# Patient Record
Sex: Female | Born: 1990 | Race: White | Marital: Single | Smoking: Current every day smoker
Health system: Southern US, Community
[De-identification: ages and names within clinical notes are randomized; demographics above are authoritative.]

## PROBLEM LIST (undated history)

## (undated) DIAGNOSIS — F419 Anxiety disorder, unspecified: Secondary | ICD-10-CM

## (undated) DIAGNOSIS — F32A Depression, unspecified: Secondary | ICD-10-CM

## (undated) DIAGNOSIS — F909 Attention-deficit hyperactivity disorder, unspecified type: Secondary | ICD-10-CM

## (undated) DIAGNOSIS — R569 Unspecified convulsions: Secondary | ICD-10-CM

## (undated) DIAGNOSIS — R519 Headache, unspecified: Secondary | ICD-10-CM

## (undated) DIAGNOSIS — B977 Papillomavirus as the cause of diseases classified elsewhere: Secondary | ICD-10-CM

## (undated) DIAGNOSIS — R87629 Unspecified abnormal cytological findings in specimens from vagina: Secondary | ICD-10-CM

## (undated) HISTORY — PX: CHOLECYSTECTOMY: SHX55

## (undated) HISTORY — PX: LEEP: SHX91

## (undated) HISTORY — DX: Unspecified convulsions: R56.9

## (undated) HISTORY — DX: Anxiety disorder, unspecified: F41.9

## (undated) HISTORY — DX: Headache, unspecified: R51.9

## (undated) HISTORY — DX: Attention-deficit hyperactivity disorder, unspecified type: F90.9

## (undated) HISTORY — PX: AUGMENTATION MAMMAPLASTY: SUR837

## (undated) HISTORY — DX: Unspecified abnormal cytological findings in specimens from vagina: R87.629

## (undated) HISTORY — DX: Papillomavirus as the cause of diseases classified elsewhere: B97.7

## (undated) HISTORY — DX: Depression, unspecified: F32.A

---
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
## 2011-02-07 ENCOUNTER — Emergency Department (HOSPITAL_COMMUNITY): Payer: 59

## 2011-02-07 ENCOUNTER — Emergency Department (HOSPITAL_COMMUNITY)
Admission: EM | Admit: 2011-02-07 | Discharge: 2011-02-07 | Disposition: A | Discharge status: Home / Self Care | Payer: 59 | Attending: Emergency Medicine | Admitting: Emergency Medicine

## 2011-02-07 DIAGNOSIS — R509 Fever, unspecified: Secondary | ICD-10-CM | POA: Insufficient documentation

## 2011-02-07 DIAGNOSIS — R5381 Other malaise: Secondary | ICD-10-CM | POA: Insufficient documentation

## 2011-02-07 DIAGNOSIS — R112 Nausea with vomiting, unspecified: Secondary | ICD-10-CM | POA: Insufficient documentation

## 2011-02-07 DIAGNOSIS — R197 Diarrhea, unspecified: Secondary | ICD-10-CM | POA: Insufficient documentation

## 2011-02-07 DIAGNOSIS — R109 Unspecified abdominal pain: Secondary | ICD-10-CM | POA: Insufficient documentation

## 2011-02-07 DIAGNOSIS — R5383 Other fatigue: Secondary | ICD-10-CM | POA: Insufficient documentation

## 2011-02-07 LAB — DIFFERENTIAL
Basophils Absolute: 0.1 10*3/uL (ref 0.0–0.1)
Eosinophils Relative: 1 % (ref 0–5)
Lymphs Abs: 0.9 10*3/uL (ref 0.7–4.0)
Neutro Abs: 4.6 10*3/uL (ref 1.7–7.7)

## 2011-02-07 LAB — CBC
MCH: 30.3 pg (ref 26.0–34.0)
MCHC: 34.9 g/dL (ref 30.0–36.0)
Platelets: 191 10*3/uL (ref 150–400)
RBC: 4.62 MIL/uL (ref 3.87–5.11)
WBC: 6.1 10*3/uL (ref 4.0–10.5)

## 2011-02-07 LAB — URINE MICROSCOPIC-ADD ON

## 2011-02-07 LAB — URINALYSIS, ROUTINE W REFLEX MICROSCOPIC
Bilirubin Urine: NEGATIVE
Glucose, UA: NEGATIVE mg/dL
Leukocytes, UA: NEGATIVE
Protein, ur: NEGATIVE mg/dL

## 2011-02-07 LAB — POCT I-STAT, CHEM 8
BUN: 11 mg/dL (ref 6–23)
Chloride: 107 mEq/L (ref 96–112)
Creatinine, Ser: 0.9 mg/dL (ref 0.50–1.10)
Glucose, Bld: 81 mg/dL (ref 70–99)
Potassium: 3.5 mEq/L (ref 3.5–5.1)
Sodium: 140 mEq/L (ref 135–145)

## 2011-02-07 LAB — CLOSTRIDIUM DIFFICILE BY PCR: Toxigenic C. Difficile by PCR: NEGATIVE

## 2011-02-07 LAB — POCT PREGNANCY, URINE: Preg Test, Ur: NEGATIVE

## 2011-02-07 MED ORDER — IOHEXOL 300 MG/ML  SOLN
100.0000 mL | Freq: Once | INTRAMUSCULAR | Status: AC | PRN
  Administered 2011-02-07: 100 mL via INTRAVENOUS

## 2011-02-11 LAB — STOOL CULTURE

## 2016-07-25 ENCOUNTER — Ambulatory Visit (INDEPENDENT_AMBULATORY_CARE_PROVIDER_SITE_OTHER): Payer: PRIVATE HEALTH INSURANCE | Admitting: Family Nurse Practitioner

## 2016-07-25 ENCOUNTER — Encounter (INDEPENDENT_AMBULATORY_CARE_PROVIDER_SITE_OTHER): Payer: Self-pay

## 2016-07-25 ENCOUNTER — Encounter (INDEPENDENT_AMBULATORY_CARE_PROVIDER_SITE_OTHER): Payer: Self-pay | Admitting: Family Nurse Practitioner

## 2016-07-25 VITALS — BP 108/73 | HR 103 | Temp 98.7°F | Resp 16 | Ht 67.0 in | Wt 185.8 lb

## 2016-07-25 DIAGNOSIS — M549 Dorsalgia, unspecified: Secondary | ICD-10-CM

## 2016-07-25 MED ORDER — TIZANIDINE HCL 4 MG PO TABS
4.0000 mg | ORAL_TABLET | Freq: Four times a day (QID) | ORAL | 0 refills | Status: AC | PRN
Start: 2016-07-25 — End: ?

## 2016-07-25 NOTE — Progress Notes (Signed)
[facility] [place]    PROGRESS NOTE      Patient: Patty Bennett   Date: 07/25/2016   MRN: [id number]     Past Medical History:   Diagnosis Date   . Anxiety    . Attention deficit hyperactivity disorder (ADHD)    . Depression      Social History     Social History   . Marital status: Single     Spouse name: N/A   . Number of children: N/A   . Years of education: N/A     Occupational History   . working full time       Social History Main Topics   . Smoking status: Current Every Day Smoker   . Smokeless tobacco: Never Used   . Alcohol use No   . Drug use: No   . Sexual activity: Yes     Other Topics Concern   . Not on file     Social History Narrative   . No narrative on file     Family History   Problem Relation Age of Onset   . Thyroid disease Mother    . Osteopenia Mother    . Hyperlipidemia Father    . Cancer Maternal Grandmother      breast and ovarian   . Stroke Paternal Grandfather            ASSESSMENT/PLAN     Patty Bennett is a 26 y.o. female    Chief Complaint   Patient presents with   . Back Pain     2 weeks   . Neck Pain        1. Other acute back pain  - Ambulatory referral to Physical Therapy  - tiZANidine (ZANAFLEX) 4 MG tablet; Take 1 tablet (4 mg total) by mouth every 6 (six) hours as needed (back pain).  Dispense: 30 tablet; Refill: 0         Will try zanaflex  Refer to PT - eval and treat  Continue gentle stretching  Follow up as needed    Risk & Benefits of the new medication(s) if started at this visit, were explained to the patient (and family) who verbalized understanding & they are in agreement to the treatment plan.     Patient (family) encouraged to contact me/clinical staff with any questions/concerns    MEDICATIONS     Current Outpatient Prescriptions   Medication Sig Dispense Refill   . amphetamine-dextroamphetamine (ADDERALL) 20 MG tablet Take 20 mg by mouth daily.     . clonazePAM (KLONOPIN) 0.5 MG tablet Take 0.5 mg by mouth as needed.     Marland Kitchen MONO-LINYAH 0.25-35  MG-MCG per tablet      . zolpidem (AMBIEN) 10 MG tablet Take 10 mg by mouth as needed for Sleep.     Marland Kitchen tiZANidine (ZANAFLEX) 4 MG tablet Take 1 tablet (4 mg total) by mouth every 6 (six) hours as needed (back pain). 30 tablet 0     No current facility-administered medications for this visit.        Allergies   Allergen Reactions   . Mucinex Chest Congestion Child [Guaifenesin]    . Omnicef [Cefdinir]    . Sulfa Antibiotics      hives       SUBJECTIVE     Chief Complaint   Patient presents with   . Back Pain     2 weeks   . Neck Pain  HPI    Pt is a pleasant 25yo female who presents for upper back pain ongoing for nearly 2 weeks. She states pain is worse with movement. She denies any trauma. Works as a Therapist, occupational. Denies any fevers. Limited ROM of neck, worse when looking right. She has tried ibuprofen, excedrin, massage and an (expired) flexeril tablet that she had. Last night she had difficulty sleeping.        ROS     Review of Systems   Constitutional: Negative for chills, fatigue and fever.   Respiratory: Negative for cough, shortness of breath and wheezing.    Cardiovascular: Negative for chest pain.   Gastrointestinal: Negative for abdominal pain.   Musculoskeletal: Positive for back pain and neck pain.   All other systems reviewed and are negative.          The following portions of the patient's history were reviewed and updated as appropriate: Allergies, Current Medications, Past Family History, Past Medical history, Past social history, Past surgical history, and Problem List.      PHYSICAL EXAM       Vitals:    07/25/16 1531   BP: 108/73   BP Site: Right arm   Patient Position: Sitting   Cuff Size: Large   Pulse: (!) 103   Resp: 16   Temp: 98.7 F (37.1 C)   TempSrc: Oral   SpO2: 98%   Weight: 84.3 kg (185 lb 12.8 oz)   Height: 1.702 m (5\' 7" )       Physical Exam   Constitutional: She appears well-developed and well-nourished.   Neck: Muscular tenderness present. No spinous process tenderness  present. Decreased range of motion present. No Brudzinski's sign noted.   Cardiovascular: Normal rate, regular rhythm and normal heart sounds.    Pulmonary/Chest: Effort normal and breath sounds normal.   Musculoskeletal:        Cervical back: She exhibits decreased range of motion, tenderness and pain. She exhibits no bony tenderness.   Neurological: She is alert.   Nursing note and vitals reviewed.      No results found for this or any previous visit.    Verl Blalock Adaia Matthies, FNP-BC  07/25/2016

## 2016-07-25 NOTE — Progress Notes (Signed)
Nursing Documentation:  Limb alert status: Patient asked and denied any limb restrictions for blood pressure/blood draws.  Has the patient seen any other providers since their last visit: no  The patient is due for nothing at this time, HM is up-to-date.

## 2020-02-08 ENCOUNTER — Ambulatory Visit (INDEPENDENT_AMBULATORY_CARE_PROVIDER_SITE_OTHER): Payer: Self-pay | Admitting: Plastic Surgery

## 2020-02-08 ENCOUNTER — Encounter: Payer: Self-pay | Admitting: Plastic Surgery

## 2020-02-08 ENCOUNTER — Other Ambulatory Visit: Payer: Self-pay

## 2020-02-08 VITALS — BP 117/81 | HR 111 | Temp 99.0°F | Ht 67.0 in | Wt 168.2 lb

## 2020-02-08 DIAGNOSIS — N644 Mastodynia: Secondary | ICD-10-CM

## 2020-02-08 DIAGNOSIS — Z719 Counseling, unspecified: Secondary | ICD-10-CM | POA: Insufficient documentation

## 2020-02-08 NOTE — Progress Notes (Signed)
Patient ID: Lindsey Swanson, female    DOB: 02/17/1991, 29 y.o.   MRN: [id number]   Chief Complaint  Patient presents with  . Advice Only  . Breast Problem    The patient is a 29 year old female here for evaluation of her breast implants.  The patient underwent saline implant placement 10 years ago in [place] with Dr. Felicie Morn.  She believes that they were placed submuscular.  She went from A cup to a C/D cup.  She does not have a card and has not been able to get a hold of anybody at Dr. Shaune Leeks office.  She has noticed that for the past 6 months the implants have started to move.  She also notices that the right lateral side has been painful and uncomfortable for 2 months.  There is been no trauma or accident that she is aware of.  She is 5 feet 7 inches tall weighs 168 pounds.  The implants appear soft.  There does not appear to be any firm capsular contracture.  I do see the rippling and noticed the movement of the implants.  At this time they appear to be above the muscle.  The patient would like to keep her size.  She has not had a mammogram.  She is otherwise in good health and works for a Radiation protection practitioner.   Review of Systems  Constitutional: Negative.  Negative for activity change.  HENT: Negative.   Eyes: Negative.   Respiratory: Negative.  Negative for chest tightness and shortness of breath.   Cardiovascular: Negative for leg swelling.  Gastrointestinal: Negative for abdominal distention and abdominal pain.  Endocrine: Negative.   Genitourinary: Negative.   Musculoskeletal: Negative.   Neurological: Negative.   Hematological: Negative.   Psychiatric/Behavioral: Negative.     History reviewed. No pertinent past medical history.  History reviewed. No pertinent surgical history.    Current Outpatient Medications:  .  amphetamine-dextroamphetamine (ADDERALL) 20 MG tablet, Take 1 tablet by mouth daily., Disp: , Rfl:  .  cloBAZam (ONFI) 10 MG tablet, Take 20 mg by mouth  daily., Disp: , Rfl:  .  clonazePAM (KLONOPIN) 0.5 MG tablet, Take 0.5 mg by mouth 2 (two) times daily as needed., Disp: , Rfl:  .  norgestimate-ethinyl estradiol (ORTHO-CYCLEN) 0.25-35 MG-MCG tablet, Take 1 tablet by mouth daily., Disp: , Rfl:  .  tiZANidine (ZANAFLEX) 4 MG tablet, Take 1 tablet by mouth as needed., Disp: , Rfl:  .  VIMPAT 150 MG TABS, Take 1 tablet by mouth 2 (two) times daily., Disp: , Rfl:  .  zolpidem (AMBIEN) 10 MG tablet, Take 10 mg by mouth at bedtime as needed., Disp: , Rfl:    Objective:   Vitals:   02/08/20 0759  BP: 117/81  Pulse: (!) 111  Temp: 99 F (37.2 C)  SpO2: 100%    Physical Exam Vitals and nursing note reviewed.  Constitutional:      Appearance: Normal appearance.  HENT:     Head: Normocephalic and atraumatic.  Eyes:     Extraocular Movements: Extraocular movements intact.  Cardiovascular:     Rate and Rhythm: Normal rate.     Pulses: Normal pulses.  Pulmonary:     Effort: Pulmonary effort is normal.  Abdominal:     General: Abdomen is flat. There is no distension.     Tenderness: There is no abdominal tenderness.  Musculoskeletal:        General: Normal range of motion.  Skin:  General: Skin is warm.     Capillary Refill: Capillary refill takes less than 2 seconds.  Neurological:     General: No focal deficit present.     Mental Status: She is alert and oriented to person, place, and time.  Psychiatric:        Mood and Affect: Mood normal.        Behavior: Behavior normal.     Assessment & Plan:  Breast pain in female - Plan: MM Digital Screening  Encounter for counseling - Plan: MM Digital Screening  Recommend a mammogram to further evaluate the breast pain. The patient is a good candidate for replacement of the implants.  She is willing to go or interested in going with silicone implants.  We will try and get her previous reports from Dr. Felicie Morn.  Quote will be provided for her.  We will wait to hear back from her she  is interested and after we get the mammogram results.  Pictures were obtained of the patient and placed in the chart with the patient's or guardian's permission.   Alena Bills King Pinzon, DO

## 2020-02-09 ENCOUNTER — Other Ambulatory Visit: Payer: Self-pay | Admitting: Plastic Surgery

## 2020-02-09 DIAGNOSIS — N644 Mastodynia: Secondary | ICD-10-CM

## 2020-02-26 ENCOUNTER — Other Ambulatory Visit: Payer: Self-pay | Admitting: Plastic Surgery

## 2020-02-26 ENCOUNTER — Other Ambulatory Visit: Payer: Self-pay

## 2020-02-26 ENCOUNTER — Ambulatory Visit
Admission: RE | Admit: 2020-02-26 | Discharge: 2020-02-26 | Disposition: A | Discharge status: Home / Self Care | Payer: BC Managed Care – PPO | Source: Ambulatory Visit | Attending: Plastic Surgery | Admitting: Plastic Surgery

## 2020-02-26 ENCOUNTER — Ambulatory Visit: Admission: RE | Admit: 2020-02-26 | Payer: BC Managed Care – PPO | Source: Ambulatory Visit

## 2020-02-26 DIAGNOSIS — N644 Mastodynia: Secondary | ICD-10-CM

## 2020-04-01 LAB — OB RESULTS CONSOLE HIV ANTIBODY (ROUTINE TESTING): HIV: NONREACTIVE

## 2020-04-01 LAB — OB RESULTS CONSOLE RUBELLA ANTIBODY, IGM: Rubella: NON-IMMUNE/NOT IMMUNE

## 2020-04-01 LAB — OB RESULTS CONSOLE RPR: RPR: NONREACTIVE

## 2020-04-01 LAB — OB RESULTS CONSOLE HEPATITIS B SURFACE ANTIGEN: Hepatitis B Surface Ag: NEGATIVE

## 2020-04-12 ENCOUNTER — Other Ambulatory Visit: Payer: Self-pay | Admitting: Obstetrics and Gynecology

## 2020-04-12 ENCOUNTER — Telehealth: Payer: Self-pay

## 2020-04-12 DIAGNOSIS — R569 Unspecified convulsions: Secondary | ICD-10-CM

## 2020-04-12 NOTE — Telephone Encounter (Signed)
Spoke with patient re referral that we received from Dr. Garlan Fillers office - per Dr. Parke Poisson he wanted her to have an early anatomy with the MD Consult.   Patient is scheduled for 04/29/20 for a 11-14 wk Anatomy and MD Consult.

## 2020-04-27 ENCOUNTER — Encounter: Payer: Self-pay | Admitting: *Deleted

## 2020-04-29 ENCOUNTER — Other Ambulatory Visit: Payer: Self-pay | Admitting: *Deleted

## 2020-04-29 ENCOUNTER — Ambulatory Visit: Payer: BC Managed Care – PPO | Attending: Obstetrics and Gynecology

## 2020-04-29 ENCOUNTER — Ambulatory Visit (HOSPITAL_BASED_OUTPATIENT_CLINIC_OR_DEPARTMENT_OTHER): Payer: BC Managed Care – PPO | Admitting: Obstetrics

## 2020-04-29 ENCOUNTER — Ambulatory Visit: Payer: BC Managed Care – PPO | Admitting: *Deleted

## 2020-04-29 ENCOUNTER — Encounter: Payer: Self-pay | Admitting: *Deleted

## 2020-04-29 ENCOUNTER — Other Ambulatory Visit: Payer: Self-pay

## 2020-04-29 VITALS — BP 121/75 | HR 92

## 2020-04-29 DIAGNOSIS — Z3A12 12 weeks gestation of pregnancy: Secondary | ICD-10-CM | POA: Insufficient documentation

## 2020-04-29 DIAGNOSIS — G40909 Epilepsy, unspecified, not intractable, without status epilepticus: Secondary | ICD-10-CM | POA: Diagnosis present

## 2020-04-29 DIAGNOSIS — R569 Unspecified convulsions: Secondary | ICD-10-CM | POA: Diagnosis not present

## 2020-04-29 DIAGNOSIS — O99351 Diseases of the nervous system complicating pregnancy, first trimester: Secondary | ICD-10-CM

## 2020-04-29 DIAGNOSIS — F419 Anxiety disorder, unspecified: Secondary | ICD-10-CM

## 2020-04-29 DIAGNOSIS — O99341 Other mental disorders complicating pregnancy, first trimester: Secondary | ICD-10-CM

## 2020-04-29 DIAGNOSIS — O3441 Maternal care for other abnormalities of cervix, first trimester: Secondary | ICD-10-CM

## 2020-04-29 DIAGNOSIS — O09891 Supervision of other high risk pregnancies, first trimester: Secondary | ICD-10-CM

## 2020-04-29 NOTE — Progress Notes (Signed)
MFM Consult Note  Lindsey Swanson is a 29 year old gravida 1 para 0 currently at 12 weeks and 2 days.  She was seen in consultation today due to multiple medication exposure.  The patient reports that prior to pregnancy, she was treated with Adderall for ADHD, Klonopin (clonazepam) for anxiety, tizanidine for muscle relaxation, and Vimpat for her seizure disorder.    She reports that once she found out she was pregnant, she discontinued the Klonopin, Adderall, and tizanidine.  She is currently taking a prenatal vitamin, folic acid, and Vimpat for seizure prophylaxis.  She was taking Klonopin 0.5 mg twice a day.  Since the Klonopin was discontinued, the patient reports that she has not been feeling normal and often feels like she may have a seizure.  The patient is being followed by a neurologist, Dr. Ramiro Harvest at [facility].  She notified her neurologist regarding her current pregnancy.  However, her neurologist has not provided any recommendations for the management of her seizure disorder.    The patient reports that she would like to resume taking Klonopin as she most likely will not be able to perform normal daily functions without this medication.  She will not need any of the other medications she was taking prior to pregnancy.  She reports that she had a blood test earlier in her pregnancy (?  Cell free DNA test ) where she was told that the fetus is a female.  However, she was not told of the results regarding fetal aneuploidy screening.  The patient was seen in consultation today to discuss the management of her medications in pregnancy  Past medical history includes anxiety and a possible seizure disorder.  Past surgical history includes gallbladder removal in 2009, breast augmentation surgery in 2011, and a LEEP.   She denies any alcohol, tobacco, or illegal drug use.  Her current medications include a prenatal vitamin, folic acid, Vimpat, and Bonjesta for treatment of nausea associated with  pregnancy.  An ultrasound performed today shows a crown-rump length that is consistent with her gestational age.  There were no obvious fetal anomalies noted on today's early ultrasound exam.  The patient was reassured by today's ultrasound findings.  The patient and her partner were advised that there is limited information regarding the pregnancy outcomes of women taking Klonopin and Vimpat in pregnancy.  She was advised that benzodiazepine treatment during pregnancy has been associated with possible increased risk of birth defects and possible neonatal withdrawal symptoms.  There is even less information regarding Vimpat treatment in pregnancy.    Although there may be some risks in pregnancy associated with treatment with Klonopin and Vimpat, the overall risk of birth defects is probably low.  She was advised that she may resume taking Klonopin during her current pregnancy and continue taking Klonopin for the duration of her pregnancy, as she may not be able to perform normal daily functions without this medication and as the benefits probably outweigh the risks.  To help with the management of the dosage of her antiseizure medications, she should probably see a local neurologist here at [facility].  A detailed fetal anatomy scan has been scheduled for her in our office at around 19 weeks to screen for congenital birth defects.  We will also refer her to pediatric cardiology for a fetal echocardiogram at around 21 to 22 weeks.  She should then continue to be followed with serial growth ultrasounds.  The patient was reassured that I have managed the pregnancies of pregnant women taking  Klonopin and Vimpat in the past with good maternal and neonatal outcomes.  We will continue to follow her closely with you throughout her pregnancy.  At the end of the consultation, both the patient and her partner stated that all of her questions have been answered to her complete satisfaction.    Thank you for  referring this patient for a Maternal-Fetal Medicine consultation.  Recommendations:  Resume Klonopin at a dosage recommended by her neurologist Continue Vimpat, prenatal vitamins, and folic acid Detailed fetal anatomy scan at around 19 weeks Fetal echocardiogram with pediatric cardiology at 21 to 22-week Serial growth ultrasounds

## 2020-05-28 NOTE — L&D Delivery Note (Signed)
Delivery Note At 3:32 AM a viable female was delivered via Vaginal, Spontaneous (Presentation: Left Occiput Anterior).  APGAR: 8, 9; weight  .   Placenta status: Spontaneous, Intact.  Cord: 3 vessels with the following complications: None.  Cord pH: n/a  Anesthesia: Epidural Episiotomy: None Lacerations:  vaginal, first degree Suture Repair: 3.0 vicryl rapide Est. Blood Loss (mL):  400  Mom to postpartum.  Baby to Couplet care / Skin to Skin.  Mitchel Honour 11/08/2020, 3:53 AM

## 2020-06-10 ENCOUNTER — Other Ambulatory Visit: Payer: Self-pay | Admitting: *Deleted

## 2020-06-10 ENCOUNTER — Ambulatory Visit: Payer: BC Managed Care – PPO | Admitting: *Deleted

## 2020-06-10 ENCOUNTER — Ambulatory Visit: Payer: BC Managed Care – PPO | Attending: Obstetrics and Gynecology

## 2020-06-10 ENCOUNTER — Other Ambulatory Visit: Payer: Self-pay

## 2020-06-10 ENCOUNTER — Encounter: Payer: Self-pay | Admitting: *Deleted

## 2020-06-10 VITALS — BP 103/56 | HR 93

## 2020-06-10 DIAGNOSIS — F419 Anxiety disorder, unspecified: Secondary | ICD-10-CM

## 2020-06-10 DIAGNOSIS — O09891 Supervision of other high risk pregnancies, first trimester: Secondary | ICD-10-CM | POA: Diagnosis not present

## 2020-06-10 DIAGNOSIS — O99352 Diseases of the nervous system complicating pregnancy, second trimester: Secondary | ICD-10-CM | POA: Diagnosis not present

## 2020-06-10 DIAGNOSIS — O99342 Other mental disorders complicating pregnancy, second trimester: Secondary | ICD-10-CM | POA: Diagnosis not present

## 2020-06-10 DIAGNOSIS — O09892 Supervision of other high risk pregnancies, second trimester: Secondary | ICD-10-CM

## 2020-06-10 DIAGNOSIS — G40909 Epilepsy, unspecified, not intractable, without status epilepticus: Secondary | ICD-10-CM

## 2020-06-10 DIAGNOSIS — Z363 Encounter for antenatal screening for malformations: Secondary | ICD-10-CM

## 2020-06-10 DIAGNOSIS — O4402 Placenta previa specified as without hemorrhage, second trimester: Secondary | ICD-10-CM | POA: Diagnosis not present

## 2020-06-10 DIAGNOSIS — Z3A18 18 weeks gestation of pregnancy: Secondary | ICD-10-CM

## 2020-07-08 ENCOUNTER — Ambulatory Visit: Payer: BC Managed Care – PPO

## 2020-10-14 LAB — OB RESULTS CONSOLE GBS: GBS: NEGATIVE

## 2020-11-06 ENCOUNTER — Other Ambulatory Visit: Payer: Self-pay

## 2020-11-06 ENCOUNTER — Encounter (HOSPITAL_COMMUNITY): Payer: Self-pay | Admitting: Obstetrics and Gynecology

## 2020-11-06 ENCOUNTER — Inpatient Hospital Stay (HOSPITAL_COMMUNITY)
Admission: AD | Admit: 2020-11-06 | Discharge: 2020-11-10 | DRG: 806 | Disposition: A | Discharge status: Home / Self Care | Payer: BC Managed Care – PPO | Attending: Obstetrics & Gynecology | Admitting: Obstetrics & Gynecology

## 2020-11-06 DIAGNOSIS — G40909 Epilepsy, unspecified, not intractable, without status epilepticus: Secondary | ICD-10-CM | POA: Diagnosis present

## 2020-11-06 DIAGNOSIS — O9852 Other viral diseases complicating childbirth: Secondary | ICD-10-CM | POA: Diagnosis present

## 2020-11-06 DIAGNOSIS — F419 Anxiety disorder, unspecified: Secondary | ICD-10-CM | POA: Diagnosis present

## 2020-11-06 DIAGNOSIS — O99354 Diseases of the nervous system complicating childbirth: Secondary | ICD-10-CM | POA: Diagnosis present

## 2020-11-06 DIAGNOSIS — Z20822 Contact with and (suspected) exposure to covid-19: Secondary | ICD-10-CM | POA: Diagnosis present

## 2020-11-06 DIAGNOSIS — Z3A40 40 weeks gestation of pregnancy: Secondary | ICD-10-CM

## 2020-11-06 DIAGNOSIS — O99344 Other mental disorders complicating childbirth: Secondary | ICD-10-CM | POA: Diagnosis present

## 2020-11-06 DIAGNOSIS — O480 Post-term pregnancy: Principal | ICD-10-CM | POA: Diagnosis present

## 2020-11-06 DIAGNOSIS — Z23 Encounter for immunization: Secondary | ICD-10-CM

## 2020-11-06 DIAGNOSIS — Z3490 Encounter for supervision of normal pregnancy, unspecified, unspecified trimester: Secondary | ICD-10-CM

## 2020-11-06 DIAGNOSIS — Z87891 Personal history of nicotine dependence: Secondary | ICD-10-CM

## 2020-11-06 DIAGNOSIS — B009 Herpesviral infection, unspecified: Secondary | ICD-10-CM | POA: Diagnosis present

## 2020-11-06 NOTE — MAU Note (Signed)
Pt reports she took a shower and dried off and  few minutes later some fluid started leaking out.  Still having a little leaking out not a big gush. Reports some period like cramping 1-2 times an hour but nothing consistent and c/o some lower back pain. Good fetal movement felt

## 2020-11-07 ENCOUNTER — Inpatient Hospital Stay (HOSPITAL_COMMUNITY): Payer: BC Managed Care – PPO | Admitting: Anesthesiology

## 2020-11-07 ENCOUNTER — Encounter (HOSPITAL_COMMUNITY): Payer: Self-pay | Admitting: Obstetrics and Gynecology

## 2020-11-07 ENCOUNTER — Other Ambulatory Visit: Payer: Self-pay

## 2020-11-07 DIAGNOSIS — O480 Post-term pregnancy: Secondary | ICD-10-CM | POA: Diagnosis present

## 2020-11-07 DIAGNOSIS — B009 Herpesviral infection, unspecified: Secondary | ICD-10-CM | POA: Diagnosis present

## 2020-11-07 DIAGNOSIS — Z20822 Contact with and (suspected) exposure to covid-19: Secondary | ICD-10-CM | POA: Diagnosis present

## 2020-11-07 DIAGNOSIS — O99344 Other mental disorders complicating childbirth: Secondary | ICD-10-CM | POA: Diagnosis present

## 2020-11-07 DIAGNOSIS — G40909 Epilepsy, unspecified, not intractable, without status epilepticus: Secondary | ICD-10-CM | POA: Diagnosis present

## 2020-11-07 DIAGNOSIS — Z23 Encounter for immunization: Secondary | ICD-10-CM | POA: Diagnosis not present

## 2020-11-07 DIAGNOSIS — O99354 Diseases of the nervous system complicating childbirth: Secondary | ICD-10-CM | POA: Diagnosis present

## 2020-11-07 DIAGNOSIS — O9852 Other viral diseases complicating childbirth: Secondary | ICD-10-CM | POA: Diagnosis present

## 2020-11-07 DIAGNOSIS — F419 Anxiety disorder, unspecified: Secondary | ICD-10-CM | POA: Diagnosis present

## 2020-11-07 DIAGNOSIS — Z3A40 40 weeks gestation of pregnancy: Secondary | ICD-10-CM | POA: Diagnosis not present

## 2020-11-07 DIAGNOSIS — Z87891 Personal history of nicotine dependence: Secondary | ICD-10-CM | POA: Diagnosis not present

## 2020-11-07 LAB — CBC
HCT: 35.4 % — ABNORMAL LOW (ref 36.0–46.0)
Hemoglobin: 11.3 g/dL — ABNORMAL LOW (ref 12.0–15.0)
MCH: 28.3 pg (ref 26.0–34.0)
MCHC: 31.9 g/dL (ref 30.0–36.0)
MCV: 88.5 fL (ref 80.0–100.0)
Platelets: 254 10*3/uL (ref 150–400)
RBC: 4 MIL/uL (ref 3.87–5.11)
RDW: 14.6 % (ref 11.5–15.5)
WBC: 13.5 10*3/uL — ABNORMAL HIGH (ref 4.0–10.5)
nRBC: 0.3 % — ABNORMAL HIGH (ref 0.0–0.2)

## 2020-11-07 LAB — RPR: RPR Ser Ql: NONREACTIVE

## 2020-11-07 LAB — POCT FERN TEST: POCT Fern Test: NEGATIVE

## 2020-11-07 LAB — AMNISURE RUPTURE OF MEMBRANE (ROM) NOT AT ARMC: Amnisure ROM: NEGATIVE

## 2020-11-07 LAB — TYPE AND SCREEN
ABO/RH(D): A POS
Antibody Screen: NEGATIVE

## 2020-11-07 LAB — SARS CORONAVIRUS 2 (TAT 6-24 HRS): SARS Coronavirus 2: NEGATIVE

## 2020-11-07 MED ORDER — PHENYLEPHRINE 40 MCG/ML (10ML) SYRINGE FOR IV PUSH (FOR BLOOD PRESSURE SUPPORT)
80.0000 ug | PREFILLED_SYRINGE | INTRAVENOUS | Status: DC | PRN
  Filled 2020-11-07: qty 10

## 2020-11-07 MED ORDER — VALACYCLOVIR HCL 500 MG PO TABS
500.0000 mg | ORAL_TABLET | Freq: Two times a day (BID) | ORAL | Status: DC
  Administered 2020-11-07 – 2020-11-08 (×2): 500 mg via ORAL
  Filled 2020-11-07 (×2): qty 1

## 2020-11-07 MED ORDER — FENTANYL-BUPIVACAINE-NACL 0.5-0.125-0.9 MG/250ML-% EP SOLN
12.0000 mL/h | EPIDURAL | Status: DC | PRN
  Administered 2020-11-07: 12 mL/h via EPIDURAL
  Filled 2020-11-07: qty 250

## 2020-11-07 MED ORDER — EPHEDRINE 5 MG/ML INJ: 10.0000 mg | INTRAVENOUS | Status: DC | PRN

## 2020-11-07 MED ORDER — MISOPROSTOL 25 MCG QUARTER TABLET
ORAL_TABLET | ORAL | Status: AC
  Filled 2020-11-07: qty 1

## 2020-11-07 MED ORDER — FLEET ENEMA 7-19 GM/118ML RE ENEM: 1.0000 | ENEMA | RECTAL | Status: DC | PRN

## 2020-11-07 MED ORDER — LACTATED RINGERS IV SOLN: INTRAVENOUS | Status: DC

## 2020-11-07 MED ORDER — ZOLPIDEM TARTRATE 5 MG PO TABS
5.0000 mg | ORAL_TABLET | Freq: Once | ORAL | Status: AC
  Administered 2020-11-07: 5 mg via ORAL
  Filled 2020-11-07: qty 1

## 2020-11-07 MED ORDER — ACETAMINOPHEN 325 MG PO TABS
650.0000 mg | ORAL_TABLET | ORAL | Status: DC | PRN
  Administered 2020-11-07 (×2): 650 mg via ORAL
  Filled 2020-11-07 (×2): qty 2

## 2020-11-07 MED ORDER — SOD CITRATE-CITRIC ACID 500-334 MG/5ML PO SOLN: 30.0000 mL | ORAL | Status: DC | PRN

## 2020-11-07 MED ORDER — TERBUTALINE SULFATE 1 MG/ML IJ SOLN
0.2500 mg | Freq: Once | INTRAMUSCULAR | Status: DC | PRN
  Filled 2020-11-07: qty 1

## 2020-11-07 MED ORDER — OXYTOCIN-SODIUM CHLORIDE 30-0.9 UT/500ML-% IV SOLN
1.0000 m[IU]/min | INTRAVENOUS | Status: DC
  Administered 2020-11-07: 2 m[IU]/min via INTRAVENOUS
  Filled 2020-11-07: qty 500

## 2020-11-07 MED ORDER — LIDOCAINE HCL (PF) 1 % IJ SOLN: 30.0000 mL | INTRAMUSCULAR | Status: DC | PRN

## 2020-11-07 MED ORDER — OXYTOCIN BOLUS FROM INFUSION
333.0000 mL | Freq: Once | INTRAVENOUS | Status: AC
  Administered 2020-11-08: 333 mL via INTRAVENOUS

## 2020-11-07 MED ORDER — LACTATED RINGERS IV SOLN: 500.0000 mL | INTRAVENOUS | Status: DC | PRN

## 2020-11-07 MED ORDER — PHENYLEPHRINE 40 MCG/ML (10ML) SYRINGE FOR IV PUSH (FOR BLOOD PRESSURE SUPPORT)
80.0000 ug | PREFILLED_SYRINGE | INTRAVENOUS | Status: DC | PRN
  Administered 2020-11-07 (×2): 80 ug via INTRAVENOUS
  Filled 2020-11-07: qty 10

## 2020-11-07 MED ORDER — LACOSAMIDE 50 MG PO TABS
150.0000 mg | ORAL_TABLET | Freq: Two times a day (BID) | ORAL | Status: DC
  Administered 2020-11-07 – 2020-11-08 (×2): 150 mg via ORAL
  Filled 2020-11-07 (×2): qty 3

## 2020-11-07 MED ORDER — DIPHENHYDRAMINE HCL 50 MG/ML IJ SOLN: 12.5000 mg | INTRAMUSCULAR | Status: DC | PRN

## 2020-11-07 MED ORDER — OXYCODONE HCL 5 MG PO TABS
5.0000 mg | ORAL_TABLET | ORAL | Status: DC | PRN
  Administered 2020-11-07 (×2): 5 mg via ORAL
  Filled 2020-11-07 (×2): qty 1

## 2020-11-07 MED ORDER — ONDANSETRON HCL 4 MG/2ML IJ SOLN
4.0000 mg | Freq: Four times a day (QID) | INTRAMUSCULAR | Status: DC | PRN
  Administered 2020-11-07 – 2020-11-08 (×2): 4 mg via INTRAVENOUS
  Filled 2020-11-07 (×2): qty 2

## 2020-11-07 MED ORDER — LIDOCAINE HCL (PF) 1 % IJ SOLN
INTRAMUSCULAR | Status: DC | PRN
  Administered 2020-11-07 (×2): 4 mL via EPIDURAL

## 2020-11-07 MED ORDER — OXYTOCIN-SODIUM CHLORIDE 30-0.9 UT/500ML-% IV SOLN
2.5000 [IU]/h | INTRAVENOUS | Status: DC
  Administered 2020-11-08: 2.5 [IU]/h via INTRAVENOUS

## 2020-11-07 MED ORDER — LACTATED RINGERS IV SOLN: 500.0000 mL | Freq: Once | INTRAVENOUS | Status: DC

## 2020-11-07 MED ORDER — OXYCODONE-ACETAMINOPHEN 5-325 MG PO TABS
2.0000 | ORAL_TABLET | ORAL | Status: DC | PRN
Start: 2020-11-07 — End: 2020-11-08

## 2020-11-07 MED ORDER — MISOPROSTOL 25 MCG QUARTER TABLET
25.0000 ug | ORAL_TABLET | ORAL | Status: DC | PRN
Start: 2020-11-07 — End: 2020-11-08
  Administered 2020-11-07 (×3): 25 ug via VAGINAL
  Filled 2020-11-07 (×2): qty 1

## 2020-11-07 MED ORDER — OXYCODONE-ACETAMINOPHEN 5-325 MG PO TABS
1.0000 | ORAL_TABLET | ORAL | Status: DC | PRN
Start: 2020-11-07 — End: 2020-11-08

## 2020-11-07 NOTE — Progress Notes (Signed)
Lindsey Swanson is a 30 y.o. G1P0 at [redacted]w[redacted]d by ultrasound admitted for induction of labor due to Elective at term.  Subjective: Feeling stronger and more frequent CTX.  No VB or LOF.  Active FM.  Objective: BP 127/90   Pulse 90   Temp 97.8 F (36.6 C) (Oral)   Resp 18   Ht 5\' 8"  (1.727 m)   Wt 108.9 kg   LMP 02/01/2020 (Exact Date)   SpO2 99%   BMI 36.49 kg/m  No intake/output data recorded. No intake/output data recorded.  FHT:  FHR: 135 bpm, variability: moderate,  accelerations:  Present,  decelerations:  Absent UC:   regular, every 3-4 minutes SVE:   Dilation: 2 Effacement (%): 50 Station: -3 Exam by:: [email or web address] RN  Labs: Lab Results  Component Value Date   WBC 13.5 (H) 11/07/2020   HGB 11.3 (L) 11/07/2020   HCT 35.4 (L) 11/07/2020   MCV 88.5 11/07/2020   PLT 254 11/07/2020    Assessment / Plan: Induction of labor due to maternal request; progressing normally on cytotec  Labor: Progressing normally Preeclampsia:   n/a Fetal Wellbeing:  Category I Pain Control:  Labor support without medications I/D:  n/a Anticipated MOD:  NSVD  11/09/2020 11/07/2020, 2:07 PM

## 2020-11-07 NOTE — Anesthesia Preprocedure Evaluation (Signed)
Anesthesia Evaluation  Patient identified by MRN, date of birth, ID band Patient awake    Reviewed: Allergy & Precautions, Patient's Chart, lab work & pertinent test results  History of Anesthesia Complications Negative for: history of anesthetic complications  Airway Mallampati: II  TM Distance: >3 FB Neck ROM: Full    Dental no notable dental hx.    Pulmonary former smoker,    Pulmonary exam normal        Cardiovascular negative cardio ROS Normal cardiovascular exam     Neuro/Psych Seizures - (last 04/2019), Well Controlled,  negative psych ROS   GI/Hepatic negative GI ROS, Neg liver ROS,   Endo/Other  negative endocrine ROS  Renal/GU negative Renal ROS  negative genitourinary   Musculoskeletal negative musculoskeletal ROS (+)   Abdominal   Peds  Hematology negative hematology ROS (+)   Anesthesia Other Findings Day of surgery medications reviewed with patient.  Reproductive/Obstetrics (+) Pregnancy                             Anesthesia Physical Anesthesia Plan  ASA: 2  Anesthesia Plan: Epidural   Post-op Pain Management:    Induction:   PONV Risk Score and Plan: Treatment may vary due to age or medical condition  Airway Management Planned: Natural Airway  Additional Equipment:   Intra-op Plan:   Post-operative Plan:   Informed Consent: I have reviewed the patients History and Physical, chart, labs and discussed the procedure including the risks, benefits and alternatives for the proposed anesthesia with the patient or authorized representative who has indicated his/her understanding and acceptance.       Plan Discussed with:   Anesthesia Plan Comments:         Anesthesia Quick Evaluation

## 2020-11-07 NOTE — Progress Notes (Signed)
Lindsey Swanson is a 30 y.o. G1P0 at [redacted]w[redacted]d by ultrasound admitted for induction of labor due to Elective at term.  Subjective: Comfortable with CLEA  Objective: BP 116/65   Pulse 89   Temp 98.8 F (37.1 C) (Oral)   Resp 17   Ht 5\' 8"  (1.727 m)   Wt 108.9 kg   LMP 02/01/2020 (Exact Date)   SpO2 99%   BMI 36.49 kg/m  I/O last 3 completed shifts: In: 1956.4 [I.V.:1956.4] Out: -  Total I/O In: -  Out: 1800 [Urine:1800]  FHT:  FHR: 130 bpm, variability: moderate,  accelerations:  Present,  decelerations:  Absent UC:   difficult to monitor; IUPC placed SVE:   Dilation: 4.5 Effacement (%): 90 Station: -2 Exam by:: Dr. [email or web address]  Labs: Lab Results  Component Value Date   WBC 13.5 (H) 11/07/2020   HGB 11.3 (L) 11/07/2020   HCT 35.4 (L) 11/07/2020   MCV 88.5 11/07/2020   PLT 254 11/07/2020    Assessment / Plan: Induction of labor due to maternal request,  progressing well on pitocin  Labor: Progressing normally Preeclampsia:   n/a Fetal Wellbeing:  Category I Pain Control:  Epidural I/D:  n/a Anticipated MOD:  NSVD  11/09/2020 11/07/2020, 11:59 PM

## 2020-11-07 NOTE — Anesthesia Procedure Notes (Signed)
Epidural Patient location during procedure: OB Start time: 11/07/2020 3:53 PM End time: 11/07/2020 3:57 PM  Staffing Anesthesiologist: Kaylyn Layer, MD Performed: anesthesiologist   Preanesthetic Checklist Completed: patient identified, IV checked, risks and benefits discussed, monitors and equipment checked, pre-op evaluation and timeout performed  Epidural Patient position: sitting Prep: DuraPrep and site prepped and draped Patient monitoring: continuous pulse ox, blood pressure and heart rate Approach: midline Location: L3-L4 Injection technique: LOR air  Needle:  Needle type: Tuohy  Needle gauge: 17 G Needle length: 9 cm Needle insertion depth: 8 cm Catheter type: closed end flexible Catheter size: 19 Gauge Catheter at skin depth: 13 cm Test dose: negative and Other (1% lidocaine)  Assessment Events: blood not aspirated, injection not painful, no injection resistance, no paresthesia and negative IV test  Additional Notes Patient identified. Risks, benefits, and alternatives discussed with patient including but not limited to bleeding, infection, nerve damage, paralysis, failed block, incomplete pain control, headache, blood pressure changes, nausea, vomiting, reactions to medication, itching, and postpartum back pain. Confirmed with bedside nurse the patient's most recent platelet count. Confirmed with patient that they are not currently taking any anticoagulation, have any bleeding history, or any family history of bleeding disorders. Patient expressed understanding and wished to proceed. All questions were answered. Sterile technique was used throughout the entire procedure. Please see nursing notes for vital signs.   Crisp LOR after one needle redirection. Test dose was given through epidural catheter and negative prior to continuing to dose epidural or start infusion. Warning signs of high block given to the patient including shortness of breath, tingling/numbness in  hands, complete motor block, or any concerning symptoms with instructions to call for help. Patient was given instructions on fall risk and not to get out of bed. All questions and concerns addressed with instructions to call with any issues or inadequate analgesia.  Reason for block:procedure for pain

## 2020-11-07 NOTE — H&P (Signed)
Lindsey Swanson is a 30 y.o. female G1 at [redacted]w[redacted]d presenting for IOL. Patient presented to MAU last night for r/o ROM; membranes are intact.  Patient is scheduled for IOL tonight and decision was made to keep patient for IOL secondary to variable decelerations x 2 during extended monitoring in MAU.  FHT has been reassuring since then.  Patient has h/o anxiety d/o and ADHD.  She was unable to d/c all meds except Klonopin which she has taken throughout this pregnancy 0.5 mg BID.  She has been counseled re: possible risk of birth defects and NAS and was referred to MFM given medication exposure.   It was recommended that patient have serial growth u/s and fetal ECHO both of which have been normal; last growth u/s on 4/8.  Patient has h/o seizure d/o; last seizure 2020.  Work up was normal and patient was told it was "stress related."  She takes Vimpat 150 mg BID and 4 mg folic acid.  She has been managed by Neuro this pregnancy (Dr. Ramiro Harvest at [facility]).  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  Patient has suffered with vertigo during pregnancy which has been treated with meclizine.  Patient has H/O LEEP.  She had low risk first tri screen.  GBS negative.  Rubella NI.  OB History     Gravida  1   Para      Term      Preterm      AB      Living         SAB      IAB      Ectopic      Multiple      Live Births             Past Medical History:  Diagnosis Date   ADHD    Anxiety    Headache    HPV in female    Seizures (HCC)    Vaginal Pap smear, abnormal    Past Surgical History:  Procedure Laterality Date   AUGMENTATION MAMMAPLASTY     CHOLECYSTECTOMY     LEEP     Family History: family history includes Breast cancer in her maternal grandmother. Social History:  reports that she quit smoking about 10 years ago. Her smoking use included cigarettes. She has never used smokeless tobacco. She reports previous alcohol use. She reports  that she does not use drugs.     Maternal Diabetes: No Genetic Screening: Normal Maternal Ultrasounds/Referrals: Normal Fetal Ultrasounds or other Referrals:  Fetal echo, Referred to Materal Fetal Medicine  Maternal Substance Abuse:  No Significant Maternal Medications:  Meds include: Other: Klonopin, Vimpat, Meclizine, Valtrex Significant Maternal Lab Results:  Group B Strep negative and Other: Rubella NI Other Comments:  None  Review of Systems Maternal Medical History:  Contractions: Onset was 1-2 hours ago.   Frequency: rare.   Perceived severity is mild.   Fetal activity: Perceived fetal activity is normal.   Last perceived fetal movement was within the past hour.   Prenatal Complications - Diabetes: none.  Dilation: 1 Effacement (%): 50 Station: -3 Exam by:: CO'Rear Blood pressure 127/85, pulse (!) 104, temperature 97.9 F (36.6 C), temperature source Oral, resp. rate 18, height 5\' 8"  (1.727 m), weight 108.9 kg, last menstrual period 02/01/2020, SpO2 99 %. Maternal Exam:  Uterine Assessment: Contraction strength is mild.  Contraction frequency is irregular.  Abdomen: Patient reports no abdominal tenderness. Fundal height is c/w  dates.   Estimated fetal weight is 8#.   Introitus: Normal vulva. Vulva is negative for lesion.  Normal vagina.  Vagina is negative for ulcerations.  Cervix: Cervix evaluated by sterile speculum exam.     Fetal Exam Fetal Monitor Review: Baseline rate: 125.  Variability: moderate (6-25 bpm).   Pattern: accelerations present and no decelerations.   Fetal State Assessment: Category I - tracings are normal.  Physical Exam Constitutional:      Appearance: Normal appearance.  HENT:     Head: Normocephalic and atraumatic.  Pulmonary:     Effort: Pulmonary effort is normal.  Abdominal:     Palpations: Abdomen is soft.  Genitourinary:    General: Normal vulva.  Vulva is no lesion.     Comments: SSE performed secondary to H/O HSV.  No  external vulvar lesions noted.  No vaginal or cervical lesions noted. Musculoskeletal:        General: Normal range of motion.     Cervical back: Normal range of motion.  Skin:    General: Skin is warm and dry.  Neurological:     Mental Status: She is alert and oriented to person, place, and time.  Psychiatric:        Mood and Affect: Mood normal.        Behavior: Behavior normal.    Prenatal labs: ABO, Rh: --/--/A POS (06/13 0118) Antibody: NEG (06/13 0118) Rubella: Nonimmune (11/05 0000) RPR: Nonreactive (11/05 0000)  HBsAg: Negative (11/05 0000)  HIV: Non-reactive (11/05 0000)  GBS: Negative/-- (05/20 0000)   Assessment/Plan: 30yo G1 at [redacted]w[redacted]d for induction of labor -VMP #2 given at 0645; will recheck cervix at 1045 -H/O HSV-has been taking prophylactic valtrex, denies prodrome and negative SSE -Seizure d/o-continue Vimpat -Anxiety-continue Klonopin.   -CLEA when desired -Anticipate NSVD   Mitchel Honour 11/07/2020, 8:35 AM

## 2020-11-07 NOTE — Progress Notes (Signed)
Lindsey Swanson is a 30 y.o. G1P0 at [redacted]w[redacted]d by ultrasound admitted for induction of labor due to Elective at term.  Subjective: Comfortable with CLEA  Objective: BP 126/75   Pulse 84   Temp 97.8 F (36.6 C) (Oral)   Resp 18   Ht 5\' 8"  (1.727 m)   Wt 108.9 kg   LMP 02/01/2020 (Exact Date)   SpO2 100%   BMI 36.49 kg/m  No intake/output data recorded. Total I/O In: 1956.4 [I.V.:1956.4] Out: -   FHT:  FHR: 130 bpm, variability: moderate,  accelerations:  Present,  decelerations:  Absent UC:   regular, every 3 minutes SVE:   Dilation: 3 Effacement (%): 70, 80 Station: -2 Exam by:: Dr.Natalyn Szymanowski AROM, clear  Labs: Lab Results  Component Value Date   WBC 13.5 (H) 11/07/2020   HGB 11.3 (L) 11/07/2020   HCT 35.4 (L) 11/07/2020   MCV 88.5 11/07/2020   PLT 254 11/07/2020    Assessment / Plan: Induction of labor due to maternal request,  progressing well on pitocin  Labor: Progressing normally Preeclampsia:   n/a Fetal Wellbeing:  Category I Pain Control:  Epidural I/D:  n/a Anticipated MOD:  NSVD  11/09/2020 11/07/2020, 5:34 PM

## 2020-11-08 ENCOUNTER — Encounter (HOSPITAL_COMMUNITY): Payer: Self-pay | Admitting: Obstetrics and Gynecology

## 2020-11-08 ENCOUNTER — Inpatient Hospital Stay (HOSPITAL_COMMUNITY)
Admission: AD | Admit: 2020-11-08 | Payer: BC Managed Care – PPO | Source: Home / Self Care | Admitting: Obstetrics & Gynecology

## 2020-11-08 ENCOUNTER — Inpatient Hospital Stay (HOSPITAL_COMMUNITY): Payer: BC Managed Care – PPO

## 2020-11-08 DIAGNOSIS — Z3490 Encounter for supervision of normal pregnancy, unspecified, unspecified trimester: Secondary | ICD-10-CM

## 2020-11-08 LAB — CBC
HCT: 31.7 % — ABNORMAL LOW (ref 36.0–46.0)
Hemoglobin: 10.4 g/dL — ABNORMAL LOW (ref 12.0–15.0)
MCH: 28.7 pg (ref 26.0–34.0)
MCHC: 32.8 g/dL (ref 30.0–36.0)
MCV: 87.6 fL (ref 80.0–100.0)
Platelets: 213 10*3/uL (ref 150–400)
RBC: 3.62 MIL/uL — ABNORMAL LOW (ref 3.87–5.11)
RDW: 14.6 % (ref 11.5–15.5)
WBC: 20.5 10*3/uL — ABNORMAL HIGH (ref 4.0–10.5)
nRBC: 0 % (ref 0.0–0.2)

## 2020-11-08 MED ORDER — MECLIZINE HCL 25 MG PO TABS
25.0000 mg | ORAL_TABLET | Freq: Two times a day (BID) | ORAL | Status: DC
  Filled 2020-11-08 (×6): qty 1

## 2020-11-08 MED ORDER — DIPHENHYDRAMINE HCL 25 MG PO CAPS: 25.0000 mg | ORAL_CAPSULE | Freq: Four times a day (QID) | ORAL | Status: DC | PRN

## 2020-11-08 MED ORDER — SIMETHICONE 80 MG PO CHEW: 80.0000 mg | CHEWABLE_TABLET | ORAL | Status: DC | PRN

## 2020-11-08 MED ORDER — WITCH HAZEL-GLYCERIN EX PADS: 1.0000 "application " | MEDICATED_PAD | CUTANEOUS | Status: DC | PRN

## 2020-11-08 MED ORDER — ZOLPIDEM TARTRATE 5 MG PO TABS: 5.0000 mg | ORAL_TABLET | Freq: Every evening | ORAL | Status: DC | PRN

## 2020-11-08 MED ORDER — COCONUT OIL OIL: 1.0000 "application " | TOPICAL_OIL | Status: DC | PRN

## 2020-11-08 MED ORDER — TETANUS-DIPHTH-ACELL PERTUSSIS 5-2.5-18.5 LF-MCG/0.5 IM SUSY: 0.5000 mL | PREFILLED_SYRINGE | Freq: Once | INTRAMUSCULAR | Status: DC

## 2020-11-08 MED ORDER — OXYCODONE-ACETAMINOPHEN 5-325 MG PO TABS
1.0000 | ORAL_TABLET | ORAL | Status: DC | PRN
  Administered 2020-11-08 – 2020-11-09 (×5): 1 via ORAL
  Filled 2020-11-08 (×5): qty 1

## 2020-11-08 MED ORDER — PRENATAL MULTIVITAMIN CH
1.0000 | ORAL_TABLET | Freq: Every day | ORAL | Status: DC
  Administered 2020-11-08 – 2020-11-10 (×3): 1 via ORAL
  Filled 2020-11-08 (×3): qty 1

## 2020-11-08 MED ORDER — IBUPROFEN 600 MG PO TABS
600.0000 mg | ORAL_TABLET | Freq: Four times a day (QID) | ORAL | Status: DC
  Administered 2020-11-08 – 2020-11-10 (×9): 600 mg via ORAL
  Filled 2020-11-08 (×9): qty 1

## 2020-11-08 MED ORDER — LACOSAMIDE 50 MG PO TABS
150.0000 mg | ORAL_TABLET | Freq: Two times a day (BID) | ORAL | Status: DC
  Administered 2020-11-08 – 2020-11-09 (×4): 150 mg via ORAL
  Filled 2020-11-08 (×6): qty 3

## 2020-11-08 MED ORDER — OXYCODONE-ACETAMINOPHEN 5-325 MG PO TABS: 2.0000 | ORAL_TABLET | ORAL | Status: DC | PRN

## 2020-11-08 MED ORDER — CLONAZEPAM 0.5 MG PO TBDP
0.5000 mg | ORAL_TABLET | Freq: Two times a day (BID) | ORAL | Status: DC
  Administered 2020-11-08 – 2020-11-09 (×3): 0.5 mg via ORAL
  Filled 2020-11-08 (×4): qty 1

## 2020-11-08 MED ORDER — SENNOSIDES-DOCUSATE SODIUM 8.6-50 MG PO TABS
2.0000 | ORAL_TABLET | Freq: Every day | ORAL | Status: DC
  Administered 2020-11-08 – 2020-11-10 (×3): 2 via ORAL
  Filled 2020-11-08 (×3): qty 2

## 2020-11-08 MED ORDER — ONDANSETRON HCL 4 MG PO TABS: 4.0000 mg | ORAL_TABLET | ORAL | Status: DC | PRN

## 2020-11-08 MED ORDER — BENZOCAINE-MENTHOL 20-0.5 % EX AERO
1.0000 "application " | INHALATION_SPRAY | CUTANEOUS | Status: DC | PRN
  Administered 2020-11-08: 1 via TOPICAL
  Filled 2020-11-08: qty 56

## 2020-11-08 MED ORDER — LACOSAMIDE 150 MG PO TABS
1.0000 | ORAL_TABLET | Freq: Two times a day (BID) | ORAL | Status: DC
  Filled 2020-11-08: qty 1

## 2020-11-08 MED ORDER — DIBUCAINE (PERIANAL) 1 % EX OINT: 1.0000 "application " | TOPICAL_OINTMENT | CUTANEOUS | Status: DC | PRN

## 2020-11-08 MED ORDER — ACETAMINOPHEN 325 MG PO TABS: 650.0000 mg | ORAL_TABLET | ORAL | Status: DC | PRN

## 2020-11-08 MED ORDER — ONDANSETRON HCL 4 MG/2ML IJ SOLN: 4.0000 mg | INTRAMUSCULAR | Status: DC | PRN

## 2020-11-08 NOTE — Lactation Note (Addendum)
This note was copied from a baby's chart. Lactation Consultation Note  Patient Name: Lindsey Swanson [id number] Date: 11/08/2020 Reason for consult: Initial assessment Age:30 years   Mother is a P1, infant is  term at 40+1 weeks . Mother has breastfed 3 times, but still says that the first breastfeeding in L&D was the best.  Mother was given [facility] brochure and basic teaching done.   Assist mother with latch infant on in cross cradle hold. Infant on and off with short shallow sucks. Mother complaints of pain . Observed nipple pinched. Teaching done . Infant latched on in football hold. Infant sustained latch with good pattern of sucking/swallows for 15 mins. Mother and father observed swallows. Infant still breastfeeding when I left the room.   Mother to continue to cue base feed infant and feed at least 8-12 times or more in 24 hours and advised to allow for cluster feeding infant as needed.  Mother to continue to due STS. Mother is aware of available LC services at [facility], BFSG'S, OP Dept, and phone # for questions or concerns about breastfeeding.  Mother receptive to all teaching and plan of care.    Maternal Data Has patient been taught Hand Expression?: Yes Does the patient have breastfeeding experience prior to this delivery?: No  Feeding Mother's Current Feeding Choice: Breast Milk  LATCH Score                    Lactation Tools Discussed/Used    Interventions    Discharge    Consult Status      Michel Bickers 11/08/2020, 1:58 PM

## 2020-11-08 NOTE — Lactation Note (Signed)
This note was copied from a baby's chart. Lactation Consultation Note Mom less than hr old at time of consult. RN had just latched baby right before LC came into room. Mom stated pinching some. LC flanged top and bottom lip a little more mom stated better. Baby popped off a couple of times. Noted nipple pinched. Mom holding baby in cross cradle position but holding baby's head instead of breast.  Mom has implants. States behind muscle. Noted Lt. Breast implant very moveable side to side. Mom stated it always does that. Noted scar around nipple. Also mom had nipple piercing at one time. Noted some colostrum coming from piercing holes.  Baby BF Eagerly. Newborn feeding habits reviewed. Answered questions. Will f/u mom on MBU.   Patient Name: Lindsey Swanson [id number] Date: 11/08/2020 Reason for consult: L&D Initial assessment;Primapara;Term;Breast augmentation Age:34 hours  Maternal Data Has patient been taught Hand Expression?: Yes Does the patient have breastfeeding experience prior to this delivery?: No  Feeding    LATCH Score Latch: Grasps breast easily, tongue down, lips flanged, rhythmical sucking.  Audible Swallowing: None  Type of Nipple: Everted at rest and after stimulation  Comfort (Breast/Nipple): Soft / non-tender  Hold (Positioning): Assistance needed to correctly position infant at breast and maintain latch.  LATCH Score: 7   Lactation Tools Discussed/Used    Interventions    Discharge WIC Program: No  Consult Status Consult Status: Follow-up Date: 11/08/20 Follow-up type: In-patient    Charyl Dancer 11/08/2020, 4:47 AM

## 2020-11-08 NOTE — Anesthesia Postprocedure Evaluation (Signed)
Anesthesia Post Note  Patient: Lindsey Swanson  Procedure(s) Performed: AN AD HOC LABOR EPIDURAL     Patient location during evaluation: Mother Baby Anesthesia Type: Epidural Level of consciousness: awake and alert Pain management: pain level controlled Vital Signs Assessment: post-procedure vital signs reviewed and stable Respiratory status: spontaneous breathing, nonlabored ventilation and respiratory function stable Cardiovascular status: stable Postop Assessment: no headache, no backache and epidural receding Anesthetic complications: no   No notable events documented.  Last Vitals:  Vitals:   11/08/20 0550 11/08/20 0706  BP: 125/73 140/77  Pulse: (!) 108 (!) 112  Resp: 20 18  Temp: 37.2 C 37 C  SpO2: 98% 98%    Last Pain:  Vitals:   11/08/20 0706  TempSrc: Axillary  PainSc:    Pain Goal: Patients Stated Pain Goal: 0 (11/07/20 6606)                 Fanny Dance

## 2020-11-08 NOTE — Social Work (Signed)
MOB was referred for history of anxiety.   * Referral screened out by Clinical Social Worker because none of the following criteria appear to apply:  ~ History of anxiety/depression during this pregnancy, or of post-partum depression following prior delivery. ~ Diagnosis of anxiety and/or depression within last 3 years. OR * MOB's symptoms currently being treated with medication and/or therapy. CSW reviewed chart and notes MOB currently prescribed Klonopin .5mg .   Please contact the Clinical Social Worker if needs arise, by [facility] request, or if MOB scores greater than 9/yes to question 10 on Edinburgh Postpartum Depression Screen.  08-03-1977, LCSWA Clinical Social Work [facility] [employer]  [phone]

## 2020-11-08 NOTE — Progress Notes (Signed)
Post Partum Day 0 Subjective: no complaints, up ad lib, and voiding  Objective: Blood pressure 140/77, pulse (!) 112, temperature 98.6 F (37 C), temperature source Axillary, resp. rate 18, height 5\' 8"  (1.727 m), weight 108.9 kg, last menstrual period 02/01/2020, SpO2 98 %, unknown if currently breastfeeding.  Physical Exam:  General: alert Lochia: appropriate Uterine Fundus: firm Incision: n/a DVT Evaluation: No evidence of DVT seen on physical exam.  Recent Labs    11/07/20 0118 11/08/20 0616  HGB 11.3* 10.4*  HCT 35.4* 31.7*    Assessment/Plan: Breastfeeding PPD#0 s/p SVD a few hours ago.  Desires to breastfeed despite the risks of clonopin. She is unable to function without it. Risks have been reviewed. Peds is aware.    LOS: 1 day   11/10/20 11/08/2020, 8:34 AM

## 2020-11-09 LAB — BIRTH TISSUE RECOVERY COLLECTION (PLACENTA DONATION)

## 2020-11-09 NOTE — Progress Notes (Signed)
Post Partum Day 1 Subjective: no complaints, up ad lib, voiding, tolerating PO, and + flatus Tired  Objective: Blood pressure 119/76, pulse 98, temperature 97.6 F (36.4 C), temperature source Oral, resp. rate 16, height 5\' 8"  (1.727 m), weight 108.9 kg, last menstrual period 02/01/2020, SpO2 96 %, unknown if currently breastfeeding.  Physical Exam:  General: alert, cooperative, and no distress Lochia: appropriate Uterine Fundus: firm, NT Incision: healing well DVT Evaluation: No evidence of DVT seen on physical exam.  Recent Labs    11/07/20 0118 11/08/20 0616  HGB 11.3* 10.4*  HCT 35.4* 31.7*    Assessment/Plan: Plan for discharge tomorrow WBC elevated, no clinical findings of infection. Probable demargination-will repeat CBC in am.   LOS: 2 days   11/10/20 II 11/09/2020, 6:53 AM

## 2020-11-10 LAB — CBC
HCT: 27.5 % — ABNORMAL LOW (ref 36.0–46.0)
Hemoglobin: 9 g/dL — ABNORMAL LOW (ref 12.0–15.0)
MCH: 29.1 pg (ref 26.0–34.0)
MCHC: 32.7 g/dL (ref 30.0–36.0)
MCV: 89 fL (ref 80.0–100.0)
Platelets: 214 10*3/uL (ref 150–400)
RBC: 3.09 MIL/uL — ABNORMAL LOW (ref 3.87–5.11)
RDW: 15.1 % (ref 11.5–15.5)
WBC: 11.9 10*3/uL — ABNORMAL HIGH (ref 4.0–10.5)
nRBC: 0 % (ref 0.0–0.2)

## 2020-11-10 MED ORDER — IBUPROFEN 600 MG PO TABS: 600.0000 mg | ORAL_TABLET | Freq: Four times a day (QID) | ORAL | 0 refills | Status: DC | PRN

## 2020-11-10 MED ORDER — MEASLES, MUMPS & RUBELLA VAC IJ SOLR
0.5000 mL | Freq: Once | INTRAMUSCULAR | Status: AC
  Administered 2020-11-10: 0.5 mL via SUBCUTANEOUS
  Filled 2020-11-10: qty 0.5

## 2020-11-10 NOTE — Lactation Note (Signed)
This note was copied from a baby's chart. Lactation Consultation Note  Patient Name: Lindsey Swanson ELTRV'U Date: 11/10/2020 Reason for consult: Follow-up assessment Age:32 hours   P1 mother whose infant is now 49 hours old.  This is a term baby at 40+1 weeks.  Mother had no questions/concerns related to breast feeding.  She reported that her RN assisted her this morning with positioning.    Mother will continue to feed 8-12 times/24 hours or sooner if baby shows feeding cues.  She has our OP phone number for concerns after discharge.  Mother has a DEBP for home use.  Father present and holding baby.   Maternal Data    Feeding    LATCH Score                    Lactation Tools Discussed/Used    Interventions    Discharge Discharge Education: Engorgement and breast care  Consult Status Consult Status: Complete Date: 11/10/20 Follow-up type: In-patient    Lindsey Swanson [name] 11/10/2020, 10:53 AM

## 2020-11-10 NOTE — Discharge Summary (Signed)
Postpartum Discharge Summary  Date of Service updated6/16/22     Patient Name: Lindsey Swanson DOB: July 11, 1990 MRN: [id number]  Date of admission: 11/06/2020 Delivery date:11/08/2020  Delivering provider: Linda Hedges  Date of discharge: 11/10/2020  Admitting diagnosis: Normal labor [O80, Z37.9] Pregnancy [Z34.90] Intrauterine pregnancy: [redacted]w[redacted]d    Secondary diagnosis:  Active Problems:   Normal labor   Pregnancy  Additional problems:      Discharge diagnosis: Term Pregnancy Delivered                                              Post partum procedures:   Augmentation: AROM, Pitocin, and Cytotec Complications: None  Hospital course: Induction of Labor With Vaginal Delivery   30y.o. yo G1P1001 at 450w1das admitted to the hospital 11/06/2020 for induction of labor.  Indication for induction: Postdates.  Patient had an uncomplicated labor course as follows: Membrane Rupture Time/Date: 5:27 PM ,11/07/2020   Delivery Method:Vaginal, Spontaneous  Episiotomy: None  Lacerations:  Vaginal;1st degree  Details of delivery can be found in separate delivery note.  Patient had a routine postpartum course. Patient is discharged home 11/10/20.  Newborn Data: Birth date:11/08/2020  Birth time:3:32 AM  Gender:Female  Living status:Living  Apgars:8 ,9  Weight:3481 g   Magnesium Sulfate received: No BMZ received: No Rhophylac:N/A MMR:N/A T-DaP:Given prenatally Flu: Yes Transfusion:No  Physical exam  Vitals:   11/09/20 0512 11/09/20 1447 11/09/20 2049 11/10/20 0532  BP: 119/76 (!) 133/96 117/81 131/75  Pulse: 98 85 96 (!) 110  Resp: 16 16 16 16   Temp: 97.6 F (36.4 C) 97.9 F (36.6 C) 98.5 F (36.9 C) 97.8 F (36.6 C)  TempSrc: Oral Oral Oral Oral  SpO2: 96%  100%   Weight:      Height:       General: alert, cooperative, and no distress Lochia: appropriate Uterine Fundus: firm Incision: Healing well with no significant drainage DVT Evaluation: No evidence of DVT seen  on physical exam. Labs: Lab Results  Component Value Date   WBC 11.9 (H) 11/10/2020   HGB 9.0 (L) 11/10/2020   HCT 27.5 (L) 11/10/2020   MCV 89.0 11/10/2020   PLT 214 11/10/2020   CMP Latest Ref Rng & Units 02/07/2011  Glucose 70 - 99 mg/dL 81  BUN 6 - 23 mg/dL 11  Creatinine 0.50 - 1.10 mg/dL 0.90  Sodium 135 - 145 mEq/L 140  Potassium 3.5 - 5.1 mEq/L 3.5  Chloride 96 - 112 mEq/L 107   Edinburgh Score: Edinburgh Postnatal Depression Scale Screening Tool 11/08/2020  I have been able to laugh and see the funny side of things. 0  I have looked forward with enjoyment to things. 0  I have blamed myself unnecessarily when things went wrong. 0  I have been anxious or worried for no good reason. 1  I have felt scared or panicky for no good reason. 0  Things have been getting on top of me. 1  I have been so unhappy that I have had difficulty sleeping. 0  I have felt sad or miserable. 1  I have been so unhappy that I have been crying. 0  The thought of harming myself has occurred to me. 0  Edinburgh Postnatal Depression Scale Total 3      After visit meds:  Allergies as of 11/10/2020  Reactions   Cefdinir Hives, Itching   Sumatriptan Other (See Comments)   Other reaction(s): Chest pain Other reaction(s): Chest pain Other reaction(s): Chest pain   Guaifenesin Nausea Only, Other (See Comments)   Guaifenesin Er Other (See Comments)   Oxcarbazepine Rash   Sulfa Antibiotics Rash   hives hives   Trazodone And Nefazodone Nausea Only        Medication List     STOP taking these medications    amphetamine-dextroamphetamine 20 MG tablet Commonly known as: ADDERALL   Bonjesta 20-20 MG Tbcr Generic drug: Doxylamine-Pyridoxine ER   cloBAZam 10 MG tablet Commonly known as: ONFI   clonazePAM 0.5 MG tablet Commonly known as: KLONOPIN   norgestimate-ethinyl estradiol 0.25-35 MG-MCG tablet Commonly known as: ORTHO-CYCLEN   tiZANidine 4 MG tablet Commonly known as:  ZANAFLEX   valACYclovir 500 MG tablet Commonly known as: VALTREX   zolpidem 10 MG tablet Commonly known as: AMBIEN       TAKE these medications    folic acid 1 MG tablet Commonly known as: FOLVITE Take 3 mg by mouth daily.   ibuprofen 600 MG tablet Commonly known as: ADVIL Take 1 tablet (600 mg total) by mouth every 6 (six) hours as needed for mild pain or moderate pain.   meclizine 25 MG tablet Commonly known as: ANTIVERT Take 25 mg by mouth 2 (two) times daily.   prenatal multivitamin Tabs tablet Take 1 tablet by mouth daily at 12 noon.   Vimpat 150 MG Tabs Generic drug: Lacosamide Take 1 tablet by mouth 2 (two) times daily.         Discharge home in stable condition Infant Feeding: Breast Infant Disposition:home with mother Discharge instruction: per After Visit Summary and Postpartum booklet. Activity: Advance as tolerated. Pelvic rest for 6 weeks.  Diet: routine diet Anticipated Birth Control: Unsure Postpartum Appointment:6 weeks Additional Postpartum F/U:    Future Appointments:No future appointments. Follow up Visit:      11/10/2020 Luz Lex, MD

## 2021-07-02 IMAGING — MG MM  DIGITAL DIAGNOSTIC BREAST BILAT IMPLANT W/ TOMO W/ CAD
8 of 17 series · 8 of 40 positions shown · non-contrast
Comparison: None.

CLINICAL DATA: Patient reports diffuse RIGHT breast pain.

EXAM:
DIGITAL DIAGNOSTIC BILATERAL MAMMOGRAM WITH IMPLANTS AND TOMO
ULTRASOUND RIGHT BREAST
The patient has retropectoral implants. Standard and implant
displaced views were performed.

[R MLO]
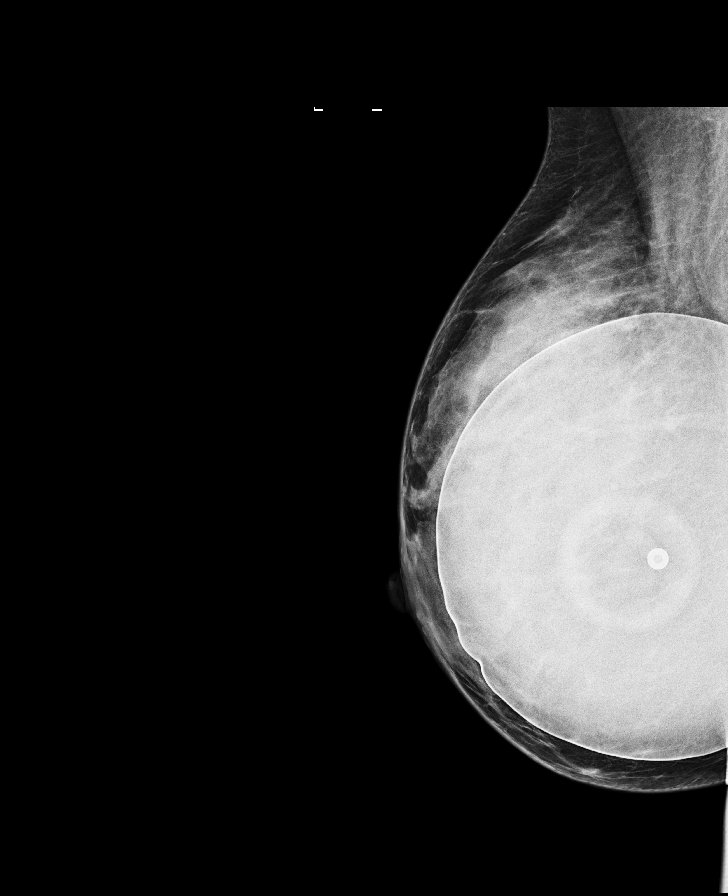

[L MLO]
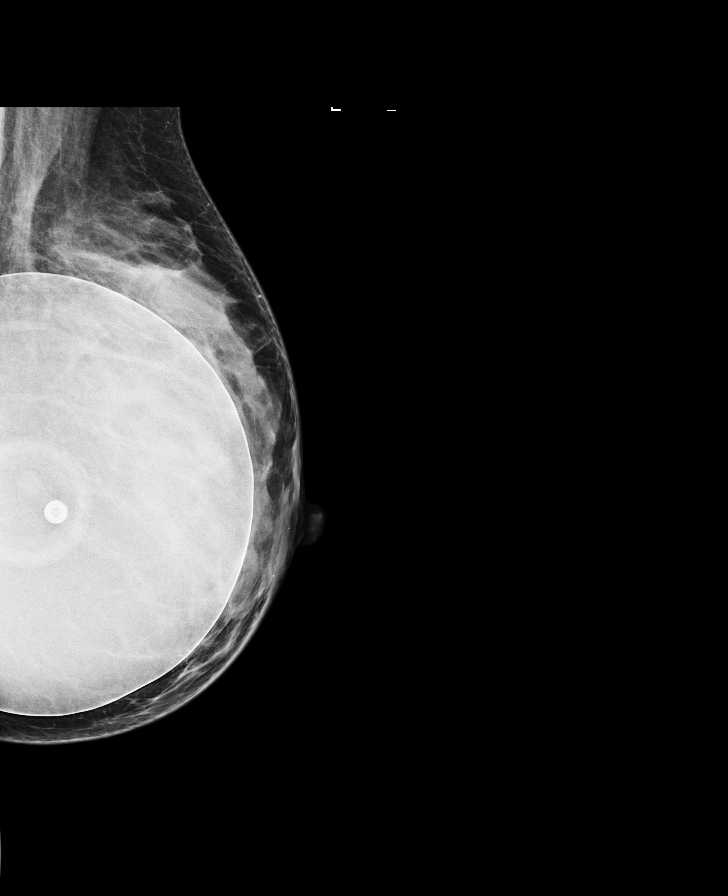

[R CC]
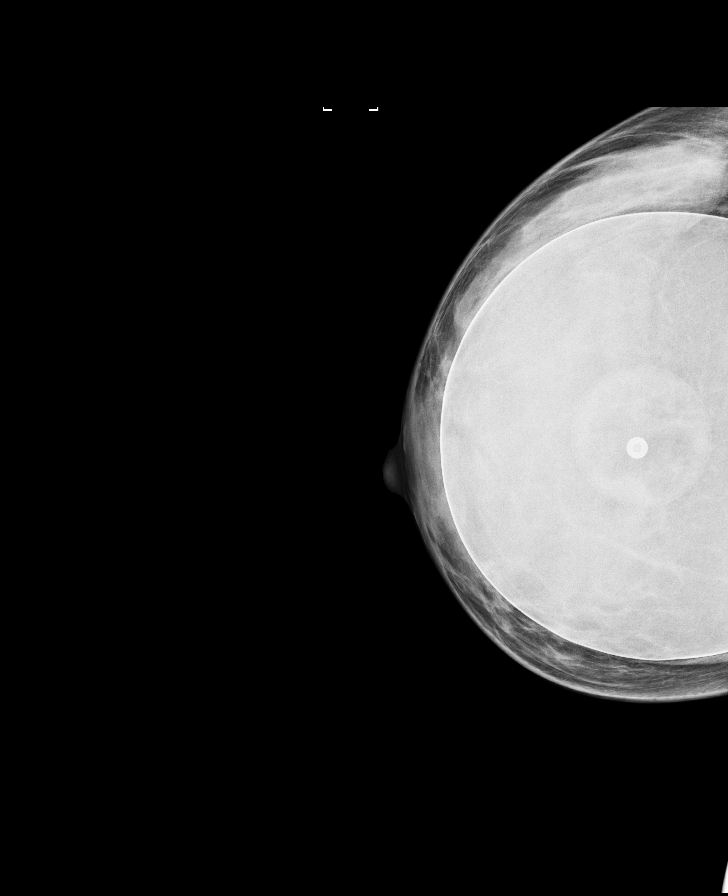

[R XCCL synth-2D (1 of 2)]
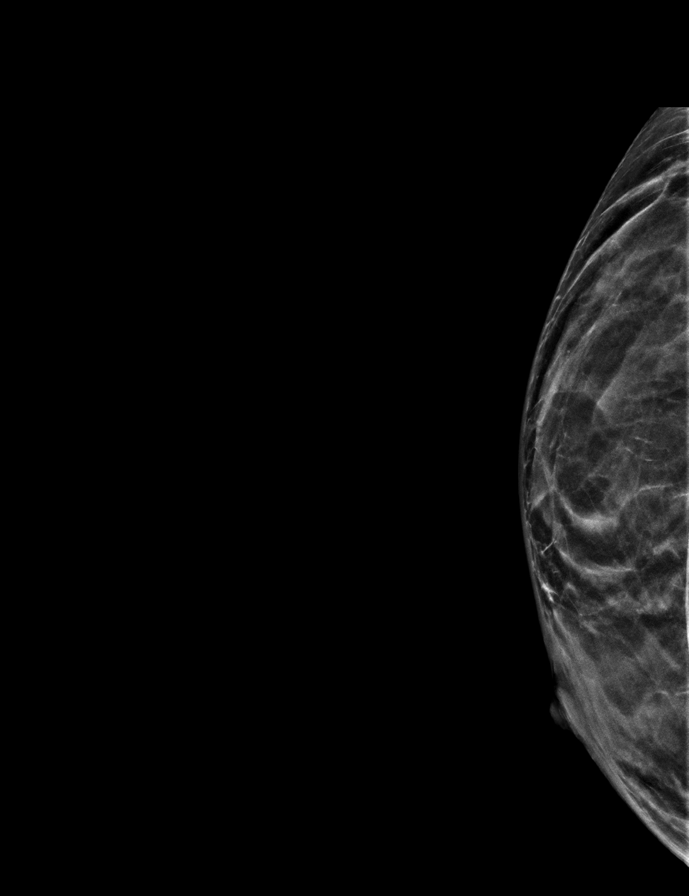

[R XCCL synth-2D (2 of 2)]
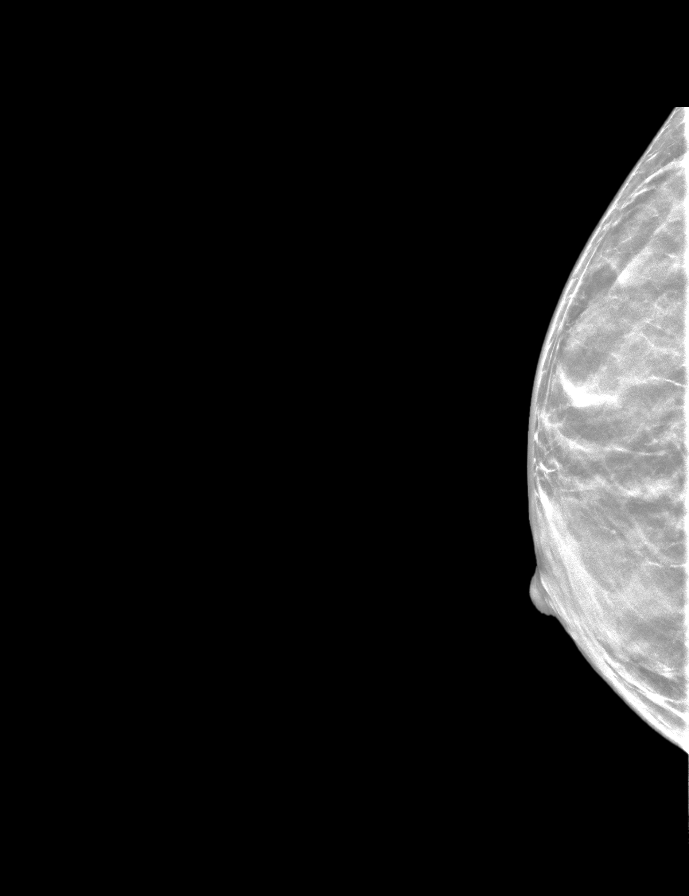

[R MLO synth-2D (1 of 3)]
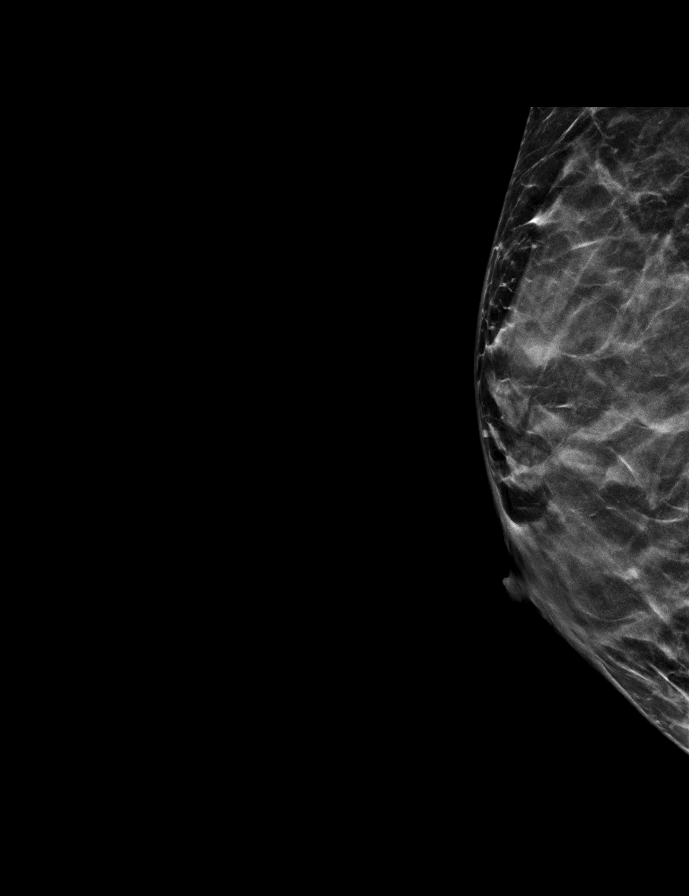

[R MLO synth-2D (2 of 3)]
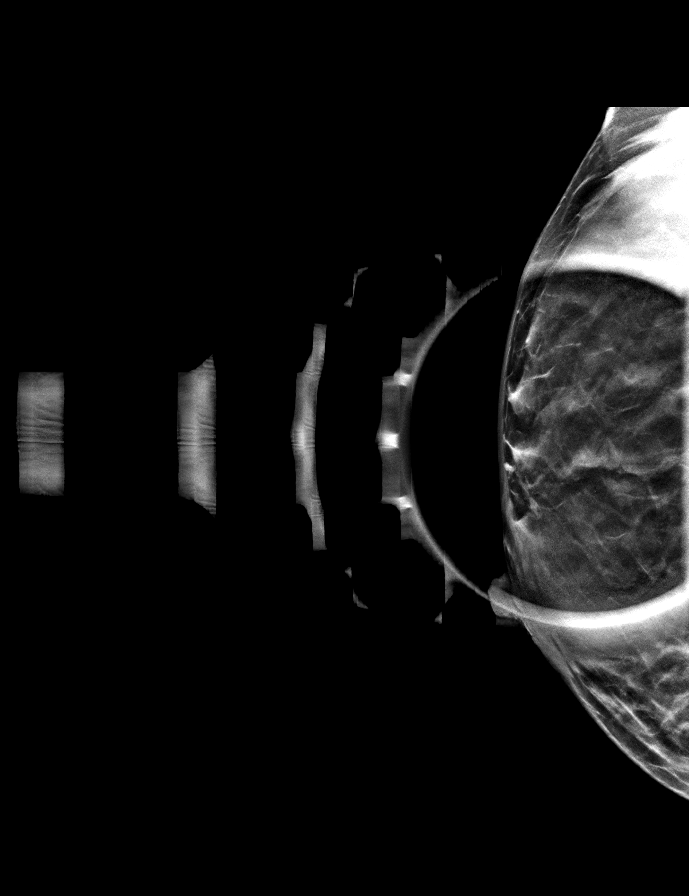

[R MLO synth-2D (3 of 3)]
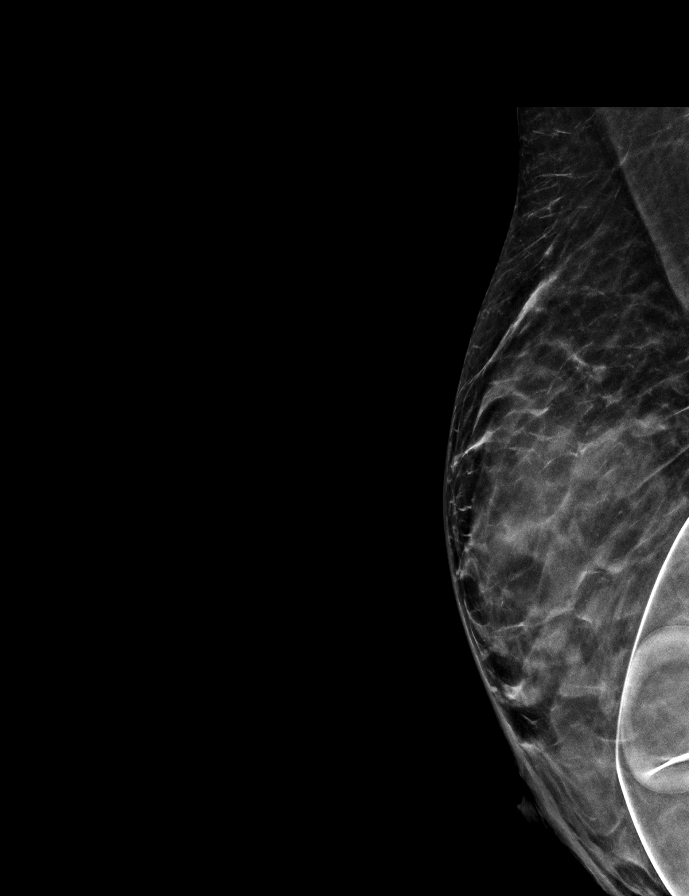

[8 of 40 positions shown; findings below may reference images not displayed]

ACR Breast Density Category c: The breast tissue is heterogeneously
dense, which may obscure small masses.
FINDINGS: Possible mass in the UPPER-OUTER QUADRANT of the RIGHT breast is
further evaluated on spot compression views. These views show dense
fibroglandular tissue without discrete mass. Further evaluation is
performed with ultrasound.

LEFT breast is negative.

Targeted ultrasound is performed, showing a small benign cyst in the
9 o'clock location of the RIGHT breast 7 centimeters from the nipple
measuring 0.5 x 0.3 x 0.7 centimeters. No other abnormalities are
identified in the LATERAL portions of the RIGHT breast.
IMPRESSION: No mammographic or ultrasound evidence for malignancy. Benign cyst
in the RIGHT breast.

RECOMMENDATION:
Screening mammogram at age 40 unless there are persistent or
intervening clinical concerns. (Code:XK-L-A2R)

I have discussed the findings and recommendations with the patient.
If applicable, a reminder letter will be sent to the patient
regarding the next appointment.

BI-RADS CATEGORY  2: Benign.

## 2021-09-06 LAB — OB RESULTS CONSOLE GBS: GBS: NEGATIVE

## 2021-10-15 IMAGING — US US MFM OB DETAIL+14 WK
1 series · 13 of 28 positions shown · non-contrast
Comparison: none

[Series 1: us mfm ob detail+14 wk · 13 of 150 slices shown]
[im 6/150]
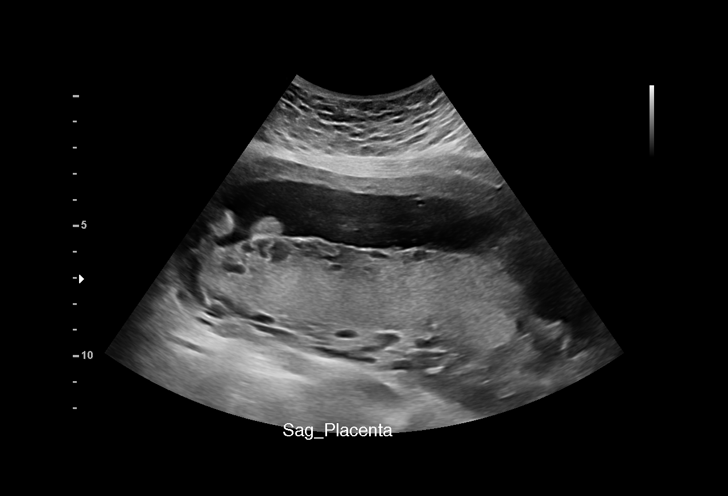
[im 17/150]
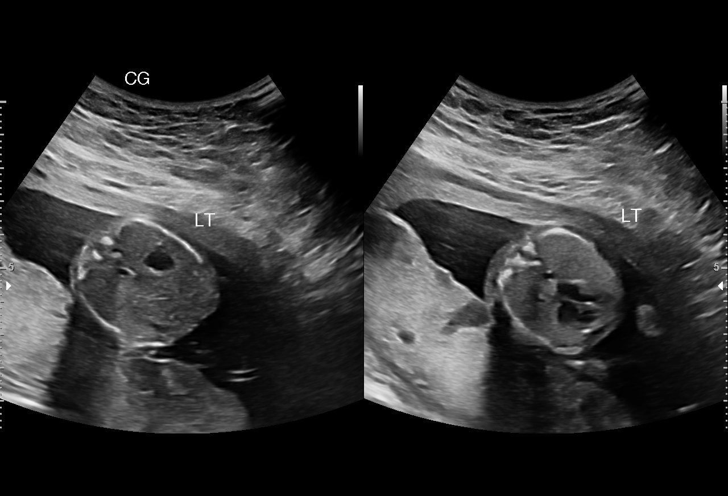
[im 28/150]
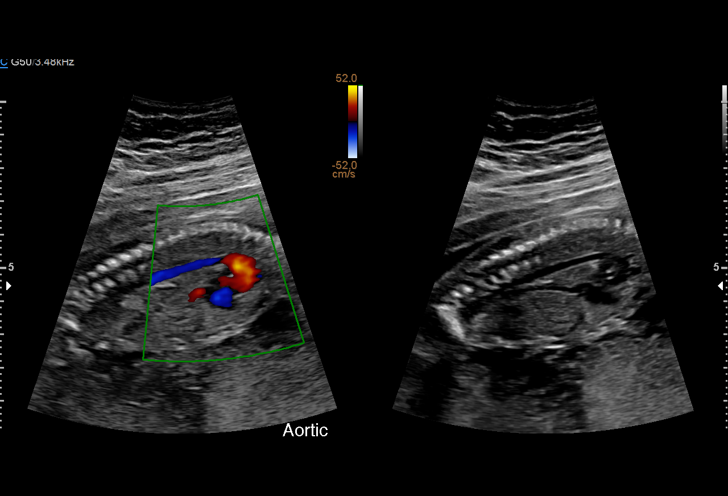
[im 39/150]
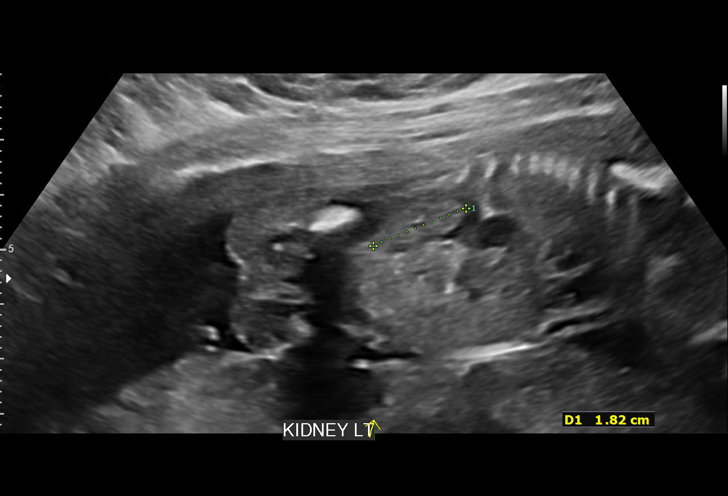
[im 50/150]
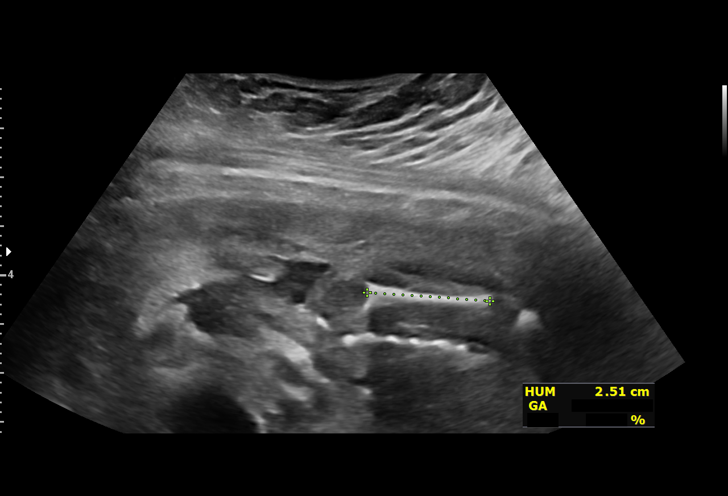
[im 61/150]
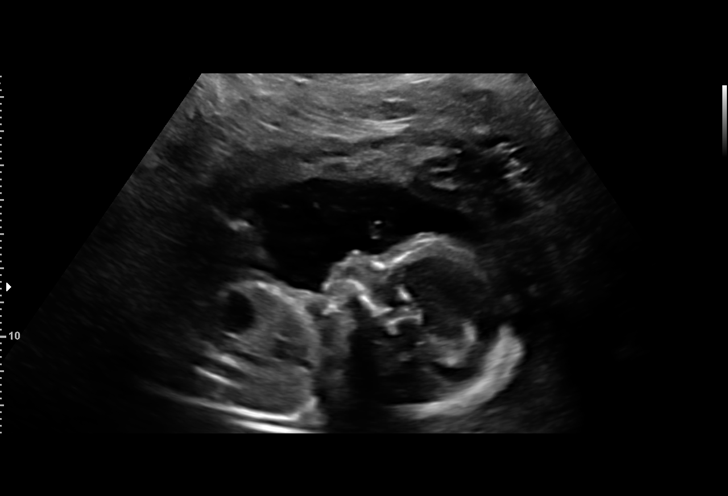
[im 78/150]
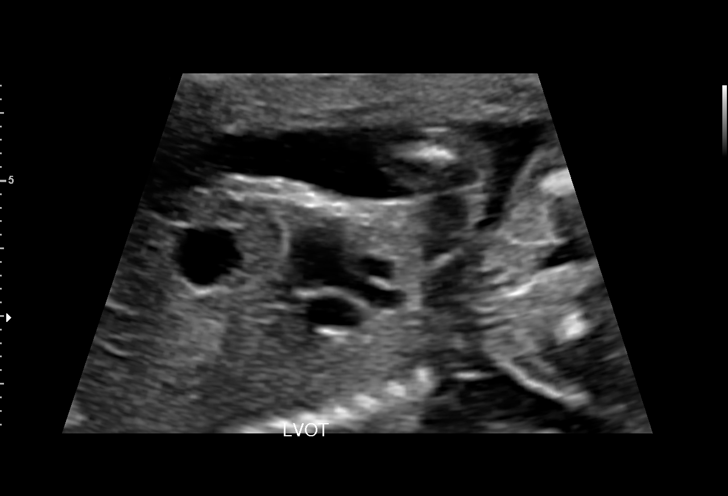
[im 89/150]
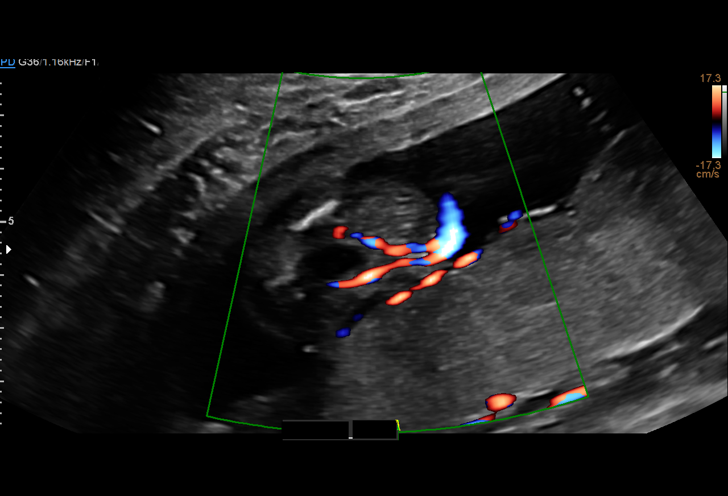
[im 100/150]
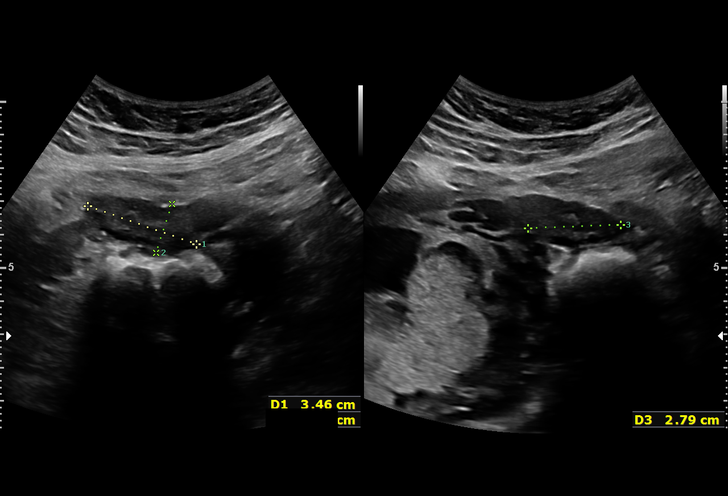
[im 111/150]
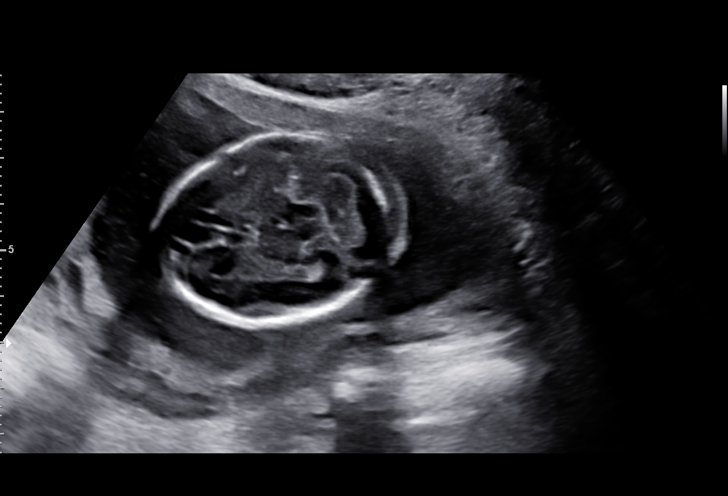
[im 122/150]
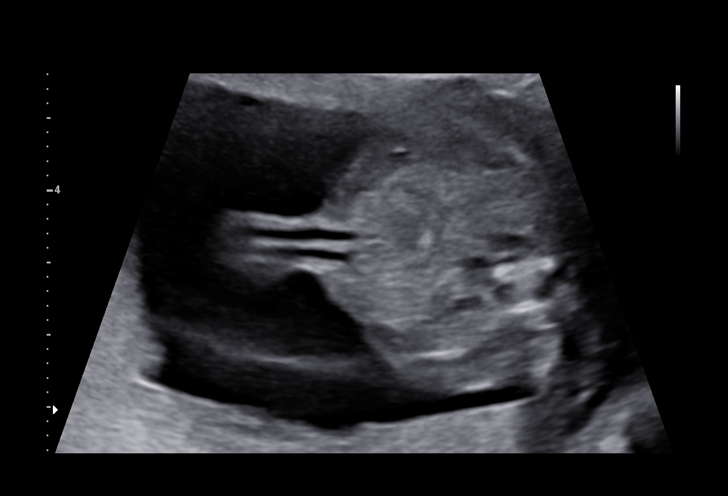
[im 133/150]
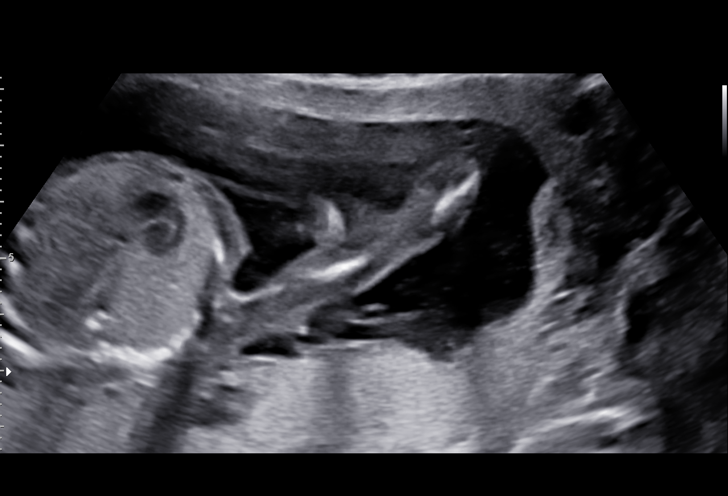
[im 144/150]
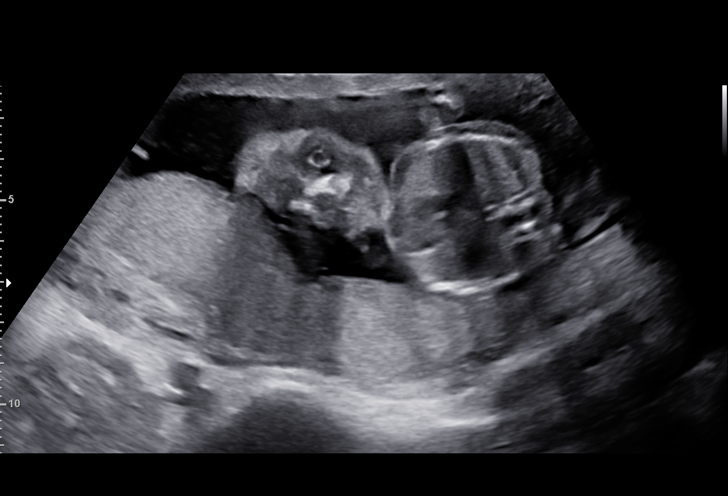

[13 of 28 positions shown; findings below may reference images not displayed]

Road [HOSPITAL]

Indications

 Medication exposure during first trimester of
 pregnancy
 Encounter for antenatal screening for
 malformations
 18 weeks gestation of pregnancy
 Medical complication of pregnancy
 (Seizures, LEEP, Anxiety)
 Placenta previa specified as without
 hemorrhage, second trimester
Fetal Evaluation

 Num Of Fetuses:         1
 Fetal Heart Rate(bpm):  148
 Cardiac Activity:       Observed
 Presentation:           Cephalic
 Placenta:               Posterior
 P. Cord Insertion:      Visualized

 Amniotic Fluid
 AFI FV:      Within normal limits

                             Largest Pocket(cm)

Biometry
 BPD:      37.4  mm     G. Age:  17w 3d          9  %    CI:        63.98   %    70 - 86
                                                         FL/HC:      17.6   %    16.1 -
 HC:      150.5  mm     G. Age:  18w 1d         21  %    HC/AC:      1.12        1.09 -
 AC:      133.8  mm     G. Age:  18w 6d         57  %    FL/BPD:     70.9   %
 FL:       26.5  mm     G. Age:  18w 0d         26  %    FL/AC:      19.8   %    20 - 24
 HUM:      25.3  mm     G. Age:  18w 0d         34  %
 CER:      18.5  mm     G. Age:  18w 2d         28  %
 NFT:       4.2  mm
 LV:        6.1  mm
 CM:        4.7  mm

 Est. FW:     239  gm      0 lb 8 oz     36  %
OB History

 Gravidity:    1         Term:   0        Prem:   0        SAB:   0
 TOP:          0       Ectopic:  0        Living: 0
Gestational Age

 LMP:           18w 4d        Date:  02/01/20                 EDD:   11/07/20
 U/S Today:     18w 1d                                        EDD:   11/10/20
 Best:          18w 4d     Det. By:  LMP  (02/01/20)          EDD:   11/07/20
Anatomy

 Cranium:               Appears normal         LVOT:                   Appears normal
 Cavum:                 Appears normal         Aortic Arch:            Appears normal
 Ventricles:            Appears normal         Ductal Arch:            Appears normal
 Choroid Plexus:        Appears normal         Diaphragm:              Appears normal
 Cerebellum:            Appears normal         Stomach:                Appears normal, left
                                                                       sided
 Posterior Fossa:       Appears normal         Abdomen:                Appears normal
 Nuchal Fold:           Appears normal         Abdominal Wall:         Appears nml (cord
                                                                       insert, abd wall)
 Face:                  Appears normal         Cord Vessels:           Appears normal (3
                        (orbits and profile)                           vessel cord)
 Lips:                  Appears normal         Kidneys:                Appear normal
 Palate:                Not well visualized    Bladder:                Appears normal
 Thoracic:              Appears normal         Spine:                  Appears normal
 Heart:                 Not well visualized    Upper Extremities:      Appears normal,
                                                                       hands nwv
 RVOT:                  Appears normal         Lower Extremities:      Appears normal

 Other:  Lenses visualized. Nasal bone visualized. Heels/feet visualized.
         Fetus appears to be female.
Cervix Uterus Adnexa

 Cervix
 Length:           3.75  cm.
 Normal appearance by transabdominal scan.

 Uterus
 No abnormality visualized.
 Right Ovary
 Not visualized.

 Left Ovary
 Not visualized.

 Cul De Sac
 No free fluid seen.

 Adnexa
 No abnormality visualized.
Comments

 This patient was seen for a detailed fetal anatomy scan due
 to maternal treatment with Klonopin for anxiety and Vimpat
 for treatment of a seizure disorder.  She denies any problems
 since her last exam and denies any problems in her current
 pregnancy.
 She reports that she had a first trimester screening test
 performed in your office which indicated a low risk for Down
 syndrome.  The results of that test was not available today.
 She was informed that the fetal growth and amniotic fluid
 level were appropriate for her gestational age.
 There were no obvious fetal anomalies noted on today's
 ultrasound exam.  However, today's exam was limited due to
 the fetal position.
 The patient was informed that anomalies may be missed due
 to technical limitations. If the fetus is in a suboptimal position
 or maternal habitus is increased, visualization of the fetus in
 the maternal uterus may be impaired.
 A marginal placenta previa was noted on today's exam.  The
 patient was reassured that the placenta will most likely move
 away from the cervix later in her pregnancy.

 Due to maternal medication exposure in the first trimester,
 she was referred to [facility] for a fetal
 echocardiogram.

 The patient was advised to establish care with a neurologist
 here at [HOSPITAL].

 A follow-up exam was scheduled in 4 weeks to complete the
 views of the fetal anatomy and to assess the fetal growth.

## 2022-03-12 LAB — OB RESULTS CONSOLE HEPATITIS B SURFACE ANTIGEN: Hepatitis B Surface Ag: NEGATIVE

## 2022-03-12 LAB — OB RESULTS CONSOLE RPR: RPR: NONREACTIVE

## 2022-03-12 LAB — OB RESULTS CONSOLE ABO/RH: RH Type: POSITIVE

## 2022-03-12 LAB — OB RESULTS CONSOLE GC/CHLAMYDIA
Chlamydia: NEGATIVE
Neisseria Gonorrhea: NEGATIVE

## 2022-03-12 LAB — OB RESULTS CONSOLE HIV ANTIBODY (ROUTINE TESTING): HIV: NONREACTIVE

## 2022-03-12 LAB — HEPATITIS C ANTIBODY: HCV Ab: NEGATIVE

## 2022-03-12 LAB — OB RESULTS CONSOLE RUBELLA ANTIBODY, IGM: Rubella: IMMUNE

## 2022-07-06 LAB — OB RESULTS CONSOLE RPR: RPR: NONREACTIVE

## 2022-09-07 LAB — OB RESULTS CONSOLE GBS: GBS: NEGATIVE

## 2022-10-03 ENCOUNTER — Inpatient Hospital Stay (EMERGENCY_DEPARTMENT_HOSPITAL)
Admission: AD | Admit: 2022-10-03 | Discharge: 2022-10-03 | Disposition: A | Discharge status: Home / Self Care | Payer: BC Managed Care – PPO | Source: Home / Self Care | Attending: Obstetrics and Gynecology | Admitting: Obstetrics and Gynecology

## 2022-10-03 ENCOUNTER — Encounter (HOSPITAL_COMMUNITY): Payer: Self-pay | Admitting: *Deleted

## 2022-10-03 DIAGNOSIS — O98513 Other viral diseases complicating pregnancy, third trimester: Secondary | ICD-10-CM | POA: Insufficient documentation

## 2022-10-03 DIAGNOSIS — Z0371 Encounter for suspected problem with amniotic cavity and membrane ruled out: Secondary | ICD-10-CM

## 2022-10-03 DIAGNOSIS — O471 False labor at or after 37 completed weeks of gestation: Secondary | ICD-10-CM | POA: Insufficient documentation

## 2022-10-03 DIAGNOSIS — Z3A40 40 weeks gestation of pregnancy: Secondary | ICD-10-CM

## 2022-10-03 DIAGNOSIS — O99353 Diseases of the nervous system complicating pregnancy, third trimester: Secondary | ICD-10-CM | POA: Insufficient documentation

## 2022-10-03 LAB — AMNISURE RUPTURE OF MEMBRANE (ROM) NOT AT ARMC: Amnisure ROM: NEGATIVE

## 2022-10-03 LAB — POCT FERN TEST: POCT Fern Test: NEGATIVE

## 2022-10-03 NOTE — MAU Note (Signed)
Lindsey Swanson is a 32 y.o. at [redacted]w[redacted]d here in MAU reporting: was at dr's appt yesterday, was 2+ cm. Started leaking about 0800, clear fluid, still coming. Has had some mucus d/c, little bleeding. Had a few contractions around 0400, stopped.  Started again around 0900,now every 9-59min. Reports +FM.  Denies problems with preg. Onset of complaint: 0800 Pain score: 4 Vitals:   10/03/22 1112  BP: 130/77  Pulse: (!) 105  Resp: 17  Temp: 98 F (36.7 C)  SpO2: 97%     Lab orders placed from triage:  fern

## 2022-10-03 NOTE — MAU Provider Note (Signed)
History     [id number]  Arrival date and time: 10/03/22 1100    Chief Complaint  Patient presents with   Contractions   Rupture of Membranes     HPI Lindsey Swanson is a 32 y.o. at [redacted]w[redacted]d with PMHx notable for HSV on valtrex, seizure disorder,  LEEP, one prior vaginal delivery, who presents for leaking fluid.   Review of outside prenatal records from [facility] (in media tab): unremarkable prenatal course  Patient reports that earlier today around 0800 was sitting on the couch when she felt a gush of fluid Since then has continued to feel trickling Some spotting but no significant bleeding Baby is moving Irregular contractions     OB History     Gravida  2   Para  1   Term  1   Preterm      AB      Living  1      SAB      IAB      Ectopic      Multiple  0   Live Births  1           Past Medical History:  Diagnosis Date   ADHD    Anxiety    Headache    HPV in female    Seizures (HCC)    Vaginal Pap smear, abnormal     Past Surgical History:  Procedure Laterality Date   AUGMENTATION MAMMAPLASTY     CHOLECYSTECTOMY     LEEP      Family History  Problem Relation Age of Onset   Hyperlipidemia Mother    Thyroid disease Mother    Heart disease Father        Heart Surgery   Breast cancer Maternal Grandmother        in her 30's    Social History   Socioeconomic History   Marital status: Single    Spouse name: Not on file   Number of children: Not on file   Years of education: Not on file   Highest education level: Not on file  Occupational History   Not on file  Tobacco Use   Smoking status: Former    Types: Cigarettes    Quit date: 02/07/2010    Years since quitting: 12.6   Smokeless tobacco: Never  Vaping Use   Vaping Use: Former   Substances: Nicotine-salt  Substance and Sexual Activity   Alcohol use: Not Currently   Drug use: Never   Sexual activity: Yes  Other Topics Concern   Not on file  Social  History Narrative   Not on file   Social Determinants of Health   Financial Resource Strain: Not on file  Food Insecurity: Not on file  Transportation Needs: Not on file  Physical Activity: Not on file  Stress: Not on file  Social Connections: Not on file  Intimate Partner Violence: Not on file    Allergies  Allergen Reactions   Cefdinir Hives and Itching   Sumatriptan Other (See Comments)    Other reaction(s): Chest pain Other reaction(s): Chest pain Other reaction(s): Chest pain    Guaifenesin Nausea Only and Other (See Comments)   Guaifenesin Er Other (See Comments)   Oxcarbazepine Rash   Sulfa Antibiotics Rash    hives hives   Trazodone And Nefazodone Nausea Only    No current facility-administered medications on file prior to encounter.   Current Outpatient Medications on File Prior to Encounter  Medication Sig Dispense  Refill   clonazePAM (KLONOPIN) 0.5 MG tablet Take 0.5 mg by mouth 2 (two) times daily as needed for anxiety. Takes 1-2 tablets prn     docusate sodium (COLACE) 100 MG capsule Take 100 mg by mouth 2 (two) times daily.     ferrous sulfate 325 (65 FE) MG EC tablet Take 325 mg by mouth daily.     folic acid (FOLVITE) 1 MG tablet Take 3 mg by mouth daily.     sertraline (ZOLOFT) 50 MG tablet Take 75 mg by mouth daily.     VIMPAT 150 MG TABS Take 1 tablet by mouth 2 (two) times daily.     ibuprofen (ADVIL) 600 MG tablet Take 1 tablet (600 mg total) by mouth every 6 (six) hours as needed for mild pain or moderate pain. 30 tablet 0   meclizine (ANTIVERT) 25 MG tablet Take 25 mg by mouth 2 (two) times daily.     Prenatal Vit-Fe Fumarate-FA (PRENATAL MULTIVITAMIN) TABS tablet Take 1 tablet by mouth daily at 12 noon.       ROS Pertinent positives and negative per HPI, all others reviewed and negative  Physical Exam   BP 128/80 (BP Location: Right Arm)   Pulse (!) 103   Temp 98 F (36.7 C) (Oral)   Resp 18   Ht 5\' 7"  (1.702 m)   Wt 103.4 kg   SpO2  97%   BMI 35.71 kg/m   Patient Vitals for the past 24 hrs:  BP Temp Temp src Pulse Resp SpO2 Height Weight  10/03/22 1130 128/80 -- -- (!) 103 18 -- -- --  10/03/22 1112 130/77 98 F (36.7 C) Oral (!) 105 17 97 % 5\' 7"  (1.702 m) 103.4 kg    Physical Exam Vitals reviewed.  Constitutional:      General: She is not in acute distress.    Appearance: She is well-developed. She is not diaphoretic.  Eyes:     General: No scleral icterus. Pulmonary:     Effort: Pulmonary effort is normal. No respiratory distress.  Abdominal:     General: There is no distension.     Palpations: Abdomen is soft.     Tenderness: There is no abdominal tenderness. There is no guarding or rebound.  Genitourinary:    Comments: Limited SSE due to patient discomfort, +small amount of pooling of what had the appearance of amniotic fluid Skin:    General: Skin is warm and dry.  Neurological:     Mental Status: She is alert.     Coordination: Coordination normal.      Cervical Exam Dilation: 3 Effacement (%): 70 Station: -2 Presentation: Vertex Exam by:: Doreatha Massed, RNC  Bedside Ultrasound Not performed.  My interpretation: n/a  FHT Baseline: 145 bpm Variability: Good {> 6 bpm) Accelerations: Reactive Decelerations: Absent Uterine activity: irregular Cat: I  Labs Results for orders placed or performed during the hospital encounter of 10/03/22 (from the past 24 hour(s))  Fern Test     Status: None   Collection Time: 10/03/22 12:41 PM  Result Value Ref Range   POCT Fern Test Negative = intact amniotic membranes   Amnisure rupture of membrane (rom)not at [facility]     Status: None   Collection Time: 10/03/22 12:46 PM  Result Value Ref Range   Amnisure ROM NEGATIVE     Imaging No results found.  MAU Course  Procedures Lab Orders         Amnisure rupture of membrane (rom)not at [facility]  Fern Test    No orders of the defined types were placed in this encounter.  Imaging Orders  No  imaging studies ordered today    MDM Moderate (Level 3-4)  Assessment and Plan  #Encounter for suspected rupture of membranes, with rupture of membranes not found #[redacted] weeks gestation of pregnancy Initial RN fern negative but very suspicious on her exam. I performed a speculum exam which appeared to show a small amount of pooling, unable to visualize cervix due to patient discomfort however that slide was also negative. Subsequently amnisure was sent which was also negative. Cervix checked and patient 3 cm (was 2 cm yesterday) and not particularly comfortable. Patient lives very far but unfortunately we do not have a medical reason to keep her for induction, routine labor precautions given. She has IOL scheduled for 41 weeks.   #FWB FHT Cat I NST: Reactive   Dispo: discharged to home in stable condition    Venora Maples, MD/MPH 10/03/22 1:37 PM  Allergies as of 10/03/2022       Reactions   Cefdinir Hives, Itching   Sumatriptan Other (See Comments)   Other reaction(s): Chest pain Other reaction(s): Chest pain Other reaction(s): Chest pain   Guaifenesin Nausea Only, Other (See Comments)   Guaifenesin Er Other (See Comments)   Oxcarbazepine Rash   Sulfa Antibiotics Rash   hives hives   Trazodone And Nefazodone Nausea Only        Medication List     STOP taking these medications    ibuprofen 600 MG tablet Commonly known as: ADVIL       TAKE these medications    clonazePAM 0.5 MG tablet Commonly known as: KLONOPIN Take 0.5 mg by mouth 2 (two) times daily as needed for anxiety. Takes 1-2 tablets prn   docusate sodium 100 MG capsule Commonly known as: COLACE Take 100 mg by mouth 2 (two) times daily.   ferrous sulfate 325 (65 FE) MG EC tablet Take 325 mg by mouth daily.   folic acid 1 MG tablet Commonly known as: FOLVITE Take 3 mg by mouth daily.   meclizine 25 MG tablet Commonly known as: ANTIVERT Take 25 mg by mouth 2 (two) times daily.   prenatal  multivitamin Tabs tablet Take 1 tablet by mouth daily at 12 noon.   sertraline 50 MG tablet Commonly known as: ZOLOFT Take 75 mg by mouth daily.   Vimpat 150 MG Tabs Generic drug: Lacosamide Take 1 tablet by mouth 2 (two) times daily.

## 2022-10-04 NOTE — H&P (Deleted)
  The note originally documented on this encounter has been moved the the encounter in which it belongs.  

## 2022-10-05 ENCOUNTER — Inpatient Hospital Stay (HOSPITAL_COMMUNITY)
Admission: AD | Admit: 2022-10-05 | Discharge: 2022-10-08 | DRG: 787 | Disposition: A | Discharge status: Home / Self Care | Payer: BC Managed Care – PPO | Attending: Obstetrics and Gynecology | Admitting: Obstetrics and Gynecology

## 2022-10-05 ENCOUNTER — Inpatient Hospital Stay (HOSPITAL_COMMUNITY): Payer: BC Managed Care – PPO | Admitting: Anesthesiology

## 2022-10-05 ENCOUNTER — Encounter (HOSPITAL_COMMUNITY): Payer: Self-pay | Admitting: Obstetrics and Gynecology

## 2022-10-05 ENCOUNTER — Encounter (HOSPITAL_COMMUNITY)
Admission: AD | Disposition: A | Discharge status: Home / Self Care | Payer: Self-pay | Source: Home / Self Care | Attending: Obstetrics and Gynecology

## 2022-10-05 ENCOUNTER — Other Ambulatory Visit: Payer: Self-pay

## 2022-10-05 DIAGNOSIS — O99354 Diseases of the nervous system complicating childbirth: Secondary | ICD-10-CM | POA: Diagnosis present

## 2022-10-05 DIAGNOSIS — Z87891 Personal history of nicotine dependence: Secondary | ICD-10-CM | POA: Diagnosis not present

## 2022-10-05 DIAGNOSIS — G40909 Epilepsy, unspecified, not intractable, without status epilepticus: Secondary | ICD-10-CM | POA: Diagnosis present

## 2022-10-05 DIAGNOSIS — Z3A41 41 weeks gestation of pregnancy: Secondary | ICD-10-CM | POA: Diagnosis not present

## 2022-10-05 DIAGNOSIS — O9832 Other infections with a predominantly sexual mode of transmission complicating childbirth: Secondary | ICD-10-CM | POA: Diagnosis present

## 2022-10-05 DIAGNOSIS — O99214 Obesity complicating childbirth: Secondary | ICD-10-CM | POA: Diagnosis present

## 2022-10-05 DIAGNOSIS — O480 Post-term pregnancy: Secondary | ICD-10-CM | POA: Diagnosis present

## 2022-10-05 DIAGNOSIS — F419 Anxiety disorder, unspecified: Secondary | ICD-10-CM | POA: Diagnosis present

## 2022-10-05 DIAGNOSIS — A6000 Herpesviral infection of urogenital system, unspecified: Secondary | ICD-10-CM | POA: Diagnosis present

## 2022-10-05 DIAGNOSIS — O99892 Other specified diseases and conditions complicating childbirth: Secondary | ICD-10-CM | POA: Diagnosis not present

## 2022-10-05 DIAGNOSIS — R519 Headache, unspecified: Secondary | ICD-10-CM | POA: Diagnosis not present

## 2022-10-05 DIAGNOSIS — O99344 Other mental disorders complicating childbirth: Secondary | ICD-10-CM | POA: Diagnosis present

## 2022-10-05 LAB — TYPE AND SCREEN
ABO/RH(D): A POS
Antibody Screen: NEGATIVE

## 2022-10-05 LAB — CBC
HCT: 42.5 % (ref 36.0–46.0)
Hemoglobin: 13.3 g/dL (ref 12.0–15.0)
MCH: 28.5 pg (ref 26.0–34.0)
MCHC: 31.3 g/dL (ref 30.0–36.0)
MCV: 91 fL (ref 80.0–100.0)
Platelets: 206 10*3/uL (ref 150–400)
RBC: 4.67 MIL/uL (ref 3.87–5.11)
RDW: 18.4 % — ABNORMAL HIGH (ref 11.5–15.5)
WBC: 14 10*3/uL — ABNORMAL HIGH (ref 4.0–10.5)
nRBC: 0 % (ref 0.0–0.2)

## 2022-10-05 LAB — RPR: RPR Ser Ql: NONREACTIVE

## 2022-10-05 SURGERY — Surgical Case
Anesthesia: Epidural | Site: Abdomen

## 2022-10-05 MED ORDER — DIPHENHYDRAMINE HCL 25 MG PO CAPS: 25.0000 mg | ORAL_CAPSULE | Freq: Four times a day (QID) | ORAL | Status: DC | PRN

## 2022-10-05 MED ORDER — MEPERIDINE HCL 25 MG/ML IJ SOLN: 6.2500 mg | INTRAMUSCULAR | Status: DC | PRN

## 2022-10-05 MED ORDER — PHENYLEPHRINE 80 MCG/ML (10ML) SYRINGE FOR IV PUSH (FOR BLOOD PRESSURE SUPPORT)
80.0000 ug | PREFILLED_SYRINGE | INTRAVENOUS | Status: AC | PRN
  Administered 2022-10-05 (×3): 80 ug via INTRAVENOUS

## 2022-10-05 MED ORDER — TERBUTALINE SULFATE 1 MG/ML IJ SOLN: 0.2500 mg | Freq: Once | INTRAMUSCULAR | Status: DC | PRN

## 2022-10-05 MED ORDER — DIPHENHYDRAMINE HCL 50 MG/ML IJ SOLN: 12.5000 mg | INTRAMUSCULAR | Status: DC | PRN

## 2022-10-05 MED ORDER — MORPHINE SULFATE (PF) 0.5 MG/ML IJ SOLN
INTRAMUSCULAR | Status: DC | PRN
  Administered 2022-10-05: 3 mg via EPIDURAL

## 2022-10-05 MED ORDER — SCOPOLAMINE 1 MG/3DAYS TD PT72: 1.0000 | MEDICATED_PATCH | Freq: Once | TRANSDERMAL | Status: DC

## 2022-10-05 MED ORDER — HYDROMORPHONE HCL 1 MG/ML IJ SOLN: 0.2000 mg | INTRAMUSCULAR | Status: DC | PRN

## 2022-10-05 MED ORDER — GENTAMICIN SULFATE 40 MG/ML IJ SOLN
5.0000 mg/kg | Freq: Once | INTRAVENOUS | Status: AC
  Administered 2022-10-05: 520 mg via INTRAVENOUS
  Filled 2022-10-05: qty 13

## 2022-10-05 MED ORDER — FENTANYL-BUPIVACAINE-NACL 0.5-0.125-0.9 MG/250ML-% EP SOLN
12.0000 mL/h | EPIDURAL | Status: DC | PRN
  Administered 2022-10-05: 12 mL/h via EPIDURAL
  Filled 2022-10-05: qty 250

## 2022-10-05 MED ORDER — LIDOCAINE HCL (PF) 1 % IJ SOLN: 30.0000 mL | INTRAMUSCULAR | Status: DC | PRN

## 2022-10-05 MED ORDER — CLONAZEPAM 0.5 MG PO TABS
0.5000 mg | ORAL_TABLET | Freq: Two times a day (BID) | ORAL | Status: DC | PRN
  Administered 2022-10-05 – 2022-10-08 (×5): 0.5 mg via ORAL
  Filled 2022-10-05 (×6): qty 1

## 2022-10-05 MED ORDER — OXYCODONE HCL 5 MG/5ML PO SOLN: 5.0000 mg | Freq: Once | ORAL | Status: DC | PRN

## 2022-10-05 MED ORDER — OXYTOCIN BOLUS FROM INFUSION: 333.0000 mL | Freq: Once | INTRAVENOUS | Status: DC

## 2022-10-05 MED ORDER — SERTRALINE HCL 50 MG PO TABS
75.0000 mg | ORAL_TABLET | Freq: Every day | ORAL | Status: DC
  Administered 2022-10-06 – 2022-10-08 (×3): 75 mg via ORAL
  Filled 2022-10-05 (×3): qty 1

## 2022-10-05 MED ORDER — OXYTOCIN-SODIUM CHLORIDE 30-0.9 UT/500ML-% IV SOLN: 2.5000 [IU]/h | INTRAVENOUS | Status: AC

## 2022-10-05 MED ORDER — DIPHENHYDRAMINE HCL 25 MG PO CAPS: 25.0000 mg | ORAL_CAPSULE | ORAL | Status: DC | PRN

## 2022-10-05 MED ORDER — OXYCODONE-ACETAMINOPHEN 5-325 MG PO TABS: 1.0000 | ORAL_TABLET | ORAL | Status: DC | PRN

## 2022-10-05 MED ORDER — EPHEDRINE 5 MG/ML INJ
10.0000 mg | INTRAVENOUS | Status: DC | PRN
  Administered 2022-10-05: 10 mg via INTRAVENOUS

## 2022-10-05 MED ORDER — PHENYLEPHRINE HCL-NACL 20-0.9 MG/250ML-% IV SOLN
INTRAVENOUS | Status: DC | PRN
  Administered 2022-10-05: 30 ug/min via INTRAVENOUS

## 2022-10-05 MED ORDER — LIDOCAINE HCL (PF) 1 % IJ SOLN
INTRAMUSCULAR | Status: DC | PRN
  Administered 2022-10-05 (×2): 4 mL via EPIDURAL

## 2022-10-05 MED ORDER — WITCH HAZEL-GLYCERIN EX PADS: 1.0000 | MEDICATED_PAD | CUTANEOUS | Status: DC | PRN

## 2022-10-05 MED ORDER — ACETAMINOPHEN 10 MG/ML IV SOLN
INTRAVENOUS | Status: DC | PRN
  Administered 2022-10-05: 1000 mg via INTRAVENOUS

## 2022-10-05 MED ORDER — DEXAMETHASONE SODIUM PHOSPHATE 10 MG/ML IJ SOLN
INTRAMUSCULAR | Status: DC | PRN
  Administered 2022-10-05: 10 mg via INTRAVENOUS

## 2022-10-05 MED ORDER — OXYTOCIN-SODIUM CHLORIDE 30-0.9 UT/500ML-% IV SOLN
1.0000 m[IU]/min | INTRAVENOUS | Status: DC
  Administered 2022-10-05: 2 m[IU]/min via INTRAVENOUS
  Filled 2022-10-05: qty 500

## 2022-10-05 MED ORDER — LACOSAMIDE 50 MG PO TABS: 150.0000 mg | ORAL_TABLET | Freq: Two times a day (BID) | ORAL | Status: DC

## 2022-10-05 MED ORDER — CLINDAMYCIN PHOSPHATE 900 MG/50ML IV SOLN
INTRAVENOUS | Status: DC | PRN
  Administered 2022-10-05: 900 mg via INTRAVENOUS

## 2022-10-05 MED ORDER — KETOROLAC TROMETHAMINE 30 MG/ML IJ SOLN
30.0000 mg | Freq: Four times a day (QID) | INTRAMUSCULAR | Status: AC | PRN
  Administered 2022-10-05: 30 mg via INTRAVENOUS

## 2022-10-05 MED ORDER — KETOROLAC TROMETHAMINE 30 MG/ML IJ SOLN: 30.0000 mg | Freq: Four times a day (QID) | INTRAMUSCULAR | Status: AC | PRN

## 2022-10-05 MED ORDER — FENTANYL CITRATE (PF) 100 MCG/2ML IJ SOLN: 50.0000 ug | INTRAMUSCULAR | Status: DC | PRN

## 2022-10-05 MED ORDER — LACOSAMIDE 150 MG PO TABS
1.0000 | ORAL_TABLET | Freq: Two times a day (BID) | ORAL | Status: DC
  Filled 2022-10-05 (×2): qty 1

## 2022-10-05 MED ORDER — OXYCODONE-ACETAMINOPHEN 5-325 MG PO TABS: 2.0000 | ORAL_TABLET | ORAL | Status: DC | PRN

## 2022-10-05 MED ORDER — CLONAZEPAM 0.5 MG PO TABS: 0.5000 mg | ORAL_TABLET | Freq: Two times a day (BID) | ORAL | Status: DC | PRN

## 2022-10-05 MED ORDER — SIMETHICONE 80 MG PO CHEW: 80.0000 mg | CHEWABLE_TABLET | ORAL | Status: DC | PRN

## 2022-10-05 MED ORDER — PRENATAL MULTIVITAMIN CH
1.0000 | ORAL_TABLET | Freq: Every day | ORAL | Status: DC
  Administered 2022-10-06 – 2022-10-08 (×3): 1 via ORAL
  Filled 2022-10-05 (×3): qty 1

## 2022-10-05 MED ORDER — NALOXONE HCL 0.4 MG/ML IJ SOLN: 0.4000 mg | INTRAMUSCULAR | Status: DC | PRN

## 2022-10-05 MED ORDER — FENTANYL CITRATE (PF) 100 MCG/2ML IJ SOLN: 25.0000 ug | INTRAMUSCULAR | Status: DC | PRN

## 2022-10-05 MED ORDER — IBUPROFEN 600 MG PO TABS
600.0000 mg | ORAL_TABLET | Freq: Four times a day (QID) | ORAL | Status: DC | PRN
  Administered 2022-10-06 – 2022-10-08 (×10): 600 mg via ORAL
  Filled 2022-10-05 (×10): qty 1

## 2022-10-05 MED ORDER — ONDANSETRON HCL 4 MG/2ML IJ SOLN: 4.0000 mg | Freq: Three times a day (TID) | INTRAMUSCULAR | Status: DC | PRN

## 2022-10-05 MED ORDER — FLEET ENEMA 7-19 GM/118ML RE ENEM: 1.0000 | ENEMA | RECTAL | Status: DC | PRN

## 2022-10-05 MED ORDER — ONDANSETRON HCL 4 MG/2ML IJ SOLN
INTRAMUSCULAR | Status: DC | PRN
  Administered 2022-10-05: 4 mg via INTRAVENOUS

## 2022-10-05 MED ORDER — SOD CITRATE-CITRIC ACID 500-334 MG/5ML PO SOLN
30.0000 mL | ORAL | Status: DC | PRN
  Administered 2022-10-05: 30 mL via ORAL
  Filled 2022-10-05: qty 30

## 2022-10-05 MED ORDER — LIDOCAINE-EPINEPHRINE (PF) 2 %-1:200000 IJ SOLN
INTRAMUSCULAR | Status: DC | PRN
  Administered 2022-10-05 (×2): 5 mL via EPIDURAL

## 2022-10-05 MED ORDER — LACTATED RINGERS IV SOLN: INTRAVENOUS | Status: DC

## 2022-10-05 MED ORDER — COCONUT OIL OIL: 1.0000 | TOPICAL_OIL | Status: DC | PRN

## 2022-10-05 MED ORDER — PHENYLEPHRINE 80 MCG/ML (10ML) SYRINGE FOR IV PUSH (FOR BLOOD PRESSURE SUPPORT)
80.0000 ug | PREFILLED_SYRINGE | INTRAVENOUS | Status: DC | PRN
  Filled 2022-10-05: qty 10

## 2022-10-05 MED ORDER — NALOXONE HCL 4 MG/10ML IJ SOLN: 1.0000 ug/kg/h | INTRAVENOUS | Status: DC | PRN

## 2022-10-05 MED ORDER — SIMETHICONE 80 MG PO CHEW
80.0000 mg | CHEWABLE_TABLET | Freq: Three times a day (TID) | ORAL | Status: DC
  Administered 2022-10-06 – 2022-10-08 (×6): 80 mg via ORAL
  Filled 2022-10-05 (×7): qty 1

## 2022-10-05 MED ORDER — SENNOSIDES-DOCUSATE SODIUM 8.6-50 MG PO TABS
2.0000 | ORAL_TABLET | Freq: Every day | ORAL | Status: DC
  Administered 2022-10-06 – 2022-10-08 (×3): 2 via ORAL
  Filled 2022-10-05 (×3): qty 2

## 2022-10-05 MED ORDER — SERTRALINE HCL 50 MG PO TABS
75.0000 mg | ORAL_TABLET | Freq: Every day | ORAL | Status: DC
  Filled 2022-10-05: qty 1

## 2022-10-05 MED ORDER — ACETAMINOPHEN 10 MG/ML IV SOLN
INTRAVENOUS | Status: AC
  Filled 2022-10-05: qty 100

## 2022-10-05 MED ORDER — LACOSAMIDE 200 MG PO TABS
200.0000 mg | ORAL_TABLET | Freq: Two times a day (BID) | ORAL | Status: DC
  Administered 2022-10-05 – 2022-10-08 (×6): 200 mg via ORAL
  Filled 2022-10-05 (×6): qty 1

## 2022-10-05 MED ORDER — ACETAMINOPHEN 325 MG PO TABS: 650.0000 mg | ORAL_TABLET | ORAL | Status: DC | PRN

## 2022-10-05 MED ORDER — PHENYLEPHRINE 80 MCG/ML (10ML) SYRINGE FOR IV PUSH (FOR BLOOD PRESSURE SUPPORT)
PREFILLED_SYRINGE | INTRAVENOUS | Status: DC | PRN
  Administered 2022-10-05: 160 ug via INTRAVENOUS
  Administered 2022-10-05: 80 ug via INTRAVENOUS

## 2022-10-05 MED ORDER — ONDANSETRON HCL 4 MG/2ML IJ SOLN
4.0000 mg | Freq: Four times a day (QID) | INTRAMUSCULAR | Status: DC | PRN
  Filled 2022-10-05: qty 2

## 2022-10-05 MED ORDER — EPHEDRINE 5 MG/ML INJ
10.0000 mg | INTRAVENOUS | Status: DC | PRN
  Filled 2022-10-05: qty 5

## 2022-10-05 MED ORDER — KETOROLAC TROMETHAMINE 30 MG/ML IJ SOLN
INTRAMUSCULAR | Status: AC
  Filled 2022-10-05: qty 1

## 2022-10-05 MED ORDER — MORPHINE SULFATE (PF) 0.5 MG/ML IJ SOLN
INTRAMUSCULAR | Status: AC
  Filled 2022-10-05: qty 10

## 2022-10-05 MED ORDER — OXYCODONE HCL 5 MG PO TABS: 5.0000 mg | ORAL_TABLET | Freq: Once | ORAL | Status: DC | PRN

## 2022-10-05 MED ORDER — LACTATED RINGERS IV SOLN
500.0000 mL | Freq: Once | INTRAVENOUS | Status: AC
  Administered 2022-10-05: 500 mL via INTRAVENOUS

## 2022-10-05 MED ORDER — ZOLPIDEM TARTRATE 5 MG PO TABS: 5.0000 mg | ORAL_TABLET | Freq: Every evening | ORAL | Status: DC | PRN

## 2022-10-05 MED ORDER — DIBUCAINE (PERIANAL) 1 % EX OINT: 1.0000 | TOPICAL_OINTMENT | CUTANEOUS | Status: DC | PRN

## 2022-10-05 MED ORDER — OXYTOCIN-SODIUM CHLORIDE 30-0.9 UT/500ML-% IV SOLN: INTRAVENOUS | Status: DC | PRN

## 2022-10-05 MED ORDER — OXYTOCIN-SODIUM CHLORIDE 30-0.9 UT/500ML-% IV SOLN: 2.5000 [IU]/h | INTRAVENOUS | Status: DC

## 2022-10-05 MED ORDER — SODIUM CHLORIDE 0.9% FLUSH: 3.0000 mL | INTRAVENOUS | Status: DC | PRN

## 2022-10-05 MED ORDER — OXYCODONE HCL 5 MG PO TABS
5.0000 mg | ORAL_TABLET | ORAL | Status: DC | PRN
  Administered 2022-10-06: 5 mg via ORAL
  Administered 2022-10-06 – 2022-10-08 (×13): 10 mg via ORAL
  Filled 2022-10-05 (×13): qty 2

## 2022-10-05 MED ORDER — LACTATED RINGERS IV SOLN: 500.0000 mL | INTRAVENOUS | Status: DC | PRN

## 2022-10-05 MED ORDER — OXYTOCIN-SODIUM CHLORIDE 30-0.9 UT/500ML-% IV SOLN
INTRAVENOUS | Status: DC | PRN
  Administered 2022-10-05: 300 mL via INTRAVENOUS

## 2022-10-05 MED ORDER — MENTHOL 3 MG MT LOZG: 1.0000 | LOZENGE | OROMUCOSAL | Status: DC | PRN

## 2022-10-05 MED ORDER — ACETAMINOPHEN 500 MG PO TABS
1000.0000 mg | ORAL_TABLET | Freq: Four times a day (QID) | ORAL | Status: DC
  Administered 2022-10-05 – 2022-10-08 (×7): 1000 mg via ORAL
  Filled 2022-10-05 (×7): qty 2

## 2022-10-05 MED ORDER — PROMETHAZINE HCL 25 MG/ML IJ SOLN: 6.2500 mg | INTRAMUSCULAR | Status: DC | PRN

## 2022-10-05 SURGICAL SUPPLY — 29 items
BENZOIN TINCTURE PRP APPL 2/3 (GAUZE/BANDAGES/DRESSINGS) IMPLANT
CHLORAPREP W/TINT 26 (MISCELLANEOUS) ×2 IMPLANT
CLAMP UMBILICAL CORD (MISCELLANEOUS) ×1 IMPLANT
CLOTH BEACON ORANGE TIMEOUT ST (SAFETY) ×1 IMPLANT
DERMABOND ADVANCED .7 DNX12 (GAUZE/BANDAGES/DRESSINGS) IMPLANT
DRSG OPSITE POSTOP 4X10 (GAUZE/BANDAGES/DRESSINGS) ×1 IMPLANT
ELECT REM PT RETURN 9FT ADLT (ELECTROSURGICAL) ×1
ELECTRODE REM PT RTRN 9FT ADLT (ELECTROSURGICAL) ×1 IMPLANT
EXTRACTOR VACUUM M CUP 4 TUBE (SUCTIONS) IMPLANT
GLOVE BIO SURGEON STRL SZ7.5 (GLOVE) ×1 IMPLANT
GLOVE BIOGEL PI IND STRL 7.0 (GLOVE) ×1 IMPLANT
GOWN STRL REUS W/TWL LRG LVL3 (GOWN DISPOSABLE) ×2 IMPLANT
KIT ABG SYR 3ML LUER SLIP (SYRINGE) ×1 IMPLANT
NDL HYPO 25X5/8 SAFETYGLIDE (NEEDLE) ×1 IMPLANT
NEEDLE HYPO 25X5/8 SAFETYGLIDE (NEEDLE) ×1 IMPLANT
NS IRRIG 1000ML POUR BTL (IV SOLUTION) ×1 IMPLANT
PACK C SECTION WH (CUSTOM PROCEDURE TRAY) ×1 IMPLANT
PAD OB MATERNITY 4.3X12.25 (PERSONAL CARE ITEMS) ×1 IMPLANT
STRIP CLOSURE SKIN 1/2X4 (GAUZE/BANDAGES/DRESSINGS) IMPLANT
SUT MNCRL 0 VIOLET CTX 36 (SUTURE) ×4 IMPLANT
SUT PDS AB 0 CTX 60 (SUTURE) ×1 IMPLANT
SUT PLAIN 0 NONE (SUTURE) IMPLANT
SUT PLAIN 2 0 (SUTURE)
SUT PLAIN 2 0 XLH (SUTURE) IMPLANT
SUT PLAIN ABS 2-0 CT1 27XMFL (SUTURE) IMPLANT
SUT VIC AB 4-0 KS 27 (SUTURE) ×1 IMPLANT
TOWEL OR 17X24 6PK STRL BLUE (TOWEL DISPOSABLE) ×1 IMPLANT
TRAY FOLEY W/BAG SLVR 14FR LF (SET/KITS/TRAYS/PACK) ×1 IMPLANT
WATER STERILE IRR 1000ML POUR (IV SOLUTION) ×1 IMPLANT

## 2022-10-05 NOTE — Progress Notes (Signed)
Patient checked related to recurrent early decelerations and subsequent prolonged. SVE 5-6/100/-3. Patient stated "I don't feel right, something is wrong." RN called for help. BP readings remain low despite IV fluid bolus and medication. Prolonged deceleration of approximately 9 minutes. Patient transported to OR for stat C/S.

## 2022-10-05 NOTE — Lactation Note (Signed)
This note was copied from a baby's chart. Lactation Consultation Note  Patient Name: Lindsey Swanson NFAOZ'H Date: 10/05/2022 Age:34 hours Reason for consult: Initial assessment;Term Mom stated baby has been BF really good. Mom stated she didn't know how much she is getting but she appears to be satisfied. As LC and mom were talking about BF baby became fussy and rooting. Dad brought baby to mom and LC assisted in football position to get baby off of mom's abdomin for feeding. Mom liked football hold. Mom could flange lips better and is more relaxing. Newborn feeding habits, STS, I&O, behavior, support, props, supply and demand reviewed. Mom encouraged to feed baby 8-12 times/24 hours and with feeding cues.  Noted milk transfer from breast by softening. Praised mom. Mom happy. Encouraged mom to call for assistance or questions.  Maternal Data Has patient been taught Hand Expression?: Yes Does the patient have breastfeeding experience prior to this delivery?: Yes How long did the patient breastfeed?: 2 weeks  Feeding    LATCH Score Latch: Grasps breast easily, tongue down, lips flanged, rhythmical sucking.  Audible Swallowing: A few with stimulation  Type of Nipple: Everted at rest and after stimulation  Comfort (Breast/Nipple): Soft / non-tender  Hold (Positioning): Assistance needed to correctly position infant at breast and maintain latch.  LATCH Score: 8   Lactation Tools Discussed/Used    Interventions Interventions: Breast feeding basics reviewed;Assisted with latch;Skin to skin;Breast massage;Hand express;Breast compression;Adjust position;Support pillows;Position options;LC Services brochure  Discharge    Consult Status Consult Status: Follow-up Date: 10/06/22 Follow-up type: In-patient    Lindsey Swanson 10/05/2022, 11:02 PM

## 2022-10-05 NOTE — Progress Notes (Signed)
Felt light headed after epidural>phenylephrine x 2 and now additional 500mg  IV fluid bolus running and she feels better  Cx 4/90/-4, difficult to reach the vtx BSUS> Vtx, cannot see evidence of compound presentation IUPC placed  FHT cat one UCs q 2-3 min, 60 mm  A/P: D/W above with patient         Continue labor         Will recheck in 1 hour

## 2022-10-05 NOTE — Transfer of Care (Signed)
Immediate Anesthesia Transfer of Care Note  Patient: Lindsey Swanson  Procedure(s) Performed: CESAREAN SECTION (Abdomen)  Patient Location: PACU  Anesthesia Type:Epidural  Level of Consciousness: awake, alert , and oriented  Airway & Oxygen Therapy: Patient Spontanous Breathing  Post-op Assessment: Report given to RN and Post -op Vital signs reviewed and stable  Post vital signs: Reviewed and stable  Last Vitals:  Vitals Value Taken Time  BP 118/58 10/05/22 1752  Temp    Pulse 83 10/05/22 1754  Resp 18 10/05/22 1754  SpO2 98 % 10/05/22 1754  Vitals shown include unvalidated device data.  Last Pain:  Vitals:   10/05/22 1230  TempSrc:   PainSc: 8          Complications: No notable events documented.

## 2022-10-05 NOTE — Op Note (Addendum)
Lindsey Swanson, SACHDEVA MEDICAL RECORD NO: [id number] ACCOUNT NO: [id number] DATE OF BIRTH: [birth year] FACILITY: [facility] LOCATION: [facility] PHYSICIAN: Guy Sandifer. Arleta Creek, MD  Operative Report   DATE OF PROCEDURE: 10/05/2022  PREOPERATIVE DIAGNOSIS:  Fetal bradycardia.  POSTOPERATIVE DIAGNOSIS: Fetal bradycardia.  PROCEDURE:  Emergent low transverse cesarean section.  SURGEON:  Harold Hedge, M.D.  ASSISTANT:  Kyla Balzarine, M.D.  ANESTHESIA:  Epidural.  ESTIMATED BLOOD LOSS:  Per anesthesia note.  SPECIMENS:  Placenta to pathology.  FINDINGS:  Viable female infant, birth weight 9 pounds 13 ounces.  Arterial cord pH, Apgars are per anesthesia note.  INDICATIONS AND CONSENT:  This patient is a 32 year old G2 P1 who is 76 weeks estimated gestational age and is admitted for early contractions.  She proceeds to 4 cm dilation, complete effacement at -3 to - 4 station.  Artificial rupture of membranes for  clear fluid has been done.  IUPC is in place.  She has an adequate labor pattern.  There was then an episode of fetal bradycardia into the 60s that persist for 6-8 minutes.  Cervical exam again revealed a persistently very high station.  Recommendation  for emergency cesarean section was made to the patient.  She agrees.  DESCRIPTION OF PROCEDURE:  The patient was taken to the operating room where she is placed on the operative table and then with a left lateral wedge.  Anesthesia has been induced. Epidural has been augmented to a surgical level.  Gentamicin and  clindamycin are ordered.  Foley catheter was already in place.  Abdomen was prepped with Betadine splash.  She was draped in sterile fashion.  After testing for adequate epidural anesthesia, skin was entered through a Pfannenstiel incision and dissection  was carried out in layers to the peritoneum, which was taken down bluntly and extended superiorly and inferiorly.  Uterus was incised in a low transverse manner and the uterine cavity  was entered bluntly with a hemostat.  Uterine incision was extended  bilaterally with the fingers.  Artificial rupture of membranes for clear fluid.  The incision was extended bilaterally with fingers.  Clear fluid was again noted.  Vertex was delivered and the baby was delivered without difficulty.  Good cry and tone is  noted.  After a 1 minute, cord was clamped and cut and the baby was handed to waiting pediatrics team.  Placenta then delivered and sent to pathology.  Uterine cavity is clean.  Uterus was closed in a running locking fashion with two imbricating layers  of 0 Monocryl suture.  Lavage was carried out and good hemostasis was noted.  Anterior peritoneum was closed in running fashion with 0 Monocryl suture which was also used to reapproximate the pyramidalis muscle in the midline.  Anterior rectus fascia was  closed in running fashion with a 0 looped PDS.  Subcutaneous layer was closed with a plain suture and the skin was closed as well.  Dressings are applied.  All counts were correct.  The patient was taken to recovery room in stable condition.   MUK D: 10/05/2022 5:50:01 pm T: 10/05/2022 10:14:00 pm  JOB: [id number]/ [id number]

## 2022-10-05 NOTE — Progress Notes (Signed)
UC this am. No ROM No changes to H&P   Today's Vitals   10/05/22 1008 10/05/22 1028 10/05/22 1102 10/05/22 1131  BP: 132/82  124/75 132/84  Pulse: (!) 103  93 (!) 109  Resp: 16     Temp: 98.2 F (36.8 C)     TempSrc: Oral     SpO2: 100%     Weight:      Height:      PainSc:  4      Body mass index is 35.71 kg/m.   FHT cat one UCs q2-4 min Pitocin infusing Cx 3/70/-2/vtx AROM clear

## 2022-10-05 NOTE — Progress Notes (Signed)
FHT cat one UCs q2-3 min, MVU 180 Cx 4/C/-3 to -4/sutures palpated/VTX applied to cx D/W patient concern for high station. FHT is reassuring. Will recheck in 1-2 hours

## 2022-10-05 NOTE — Brief Op Note (Signed)
10/05/2022  5:37 PM  PATIENT:  Lindsey Swanson  32 y.o. female  PRE-OPERATIVE DIAGNOSIS:  fetal heart rate indication  POST-OPERATIVE DIAGNOSIS:  fetal heart rate indication  PROCEDURE:  Procedure(s): CESAREAN SECTION (N/A)  SURGEON:  Surgeon(s) and Role:    * Harold Hedge, MD - Primary    * Hermina Staggers, MD - Assisting  PHYSICIAN ASSISTANT:   ASSISTANTS:   ANESTHESIA:   epidural  EBL:  per anesthesiology note   BLOOD ADMINISTERED:none  DRAINS: Urinary Catheter (Foley)   LOCAL MEDICATIONS USED:  NONE  SPECIMEN:  Source of Specimen:  placenta  DISPOSITION OF SPECIMEN:  PATHOLOGY  COUNTS:  YES  TOURNIQUET:  * No tourniquets in log *  DICTATION: .Other Dictation: Dictation Number [id number]  PLAN OF CARE: Admit to inpatient   PATIENT DISPOSITION:  PACU - hemodynamically stable.   Delay start of Pharmacological VTE agent (>24hrs) due to surgical blood loss or risk of bleeding: not applicable

## 2022-10-05 NOTE — MAU Note (Signed)
.  Lindsey Swanson is a 32 y.o. at [redacted]w[redacted]d here in MAU reporting: ctx since 4:45am. About 7 min apart. Reports bloody show and good fetal movement. 3cm last exam  Onset of complaint: 4:45 Pain score: 6-7 Vitals:   10/05/22 0742  BP: 126/75  Pulse: (!) 116  Resp: 18  Temp: 98 F (36.7 C)     FHT:145 Lab orders placed from triage:   labor eval

## 2022-10-05 NOTE — Anesthesia Procedure Notes (Signed)
Epidural Patient location during procedure: OB Start time: 10/05/2022 12:59 PM End time: 10/05/2022 1:02 PM  Staffing Anesthesiologist: [name], MD Performed: anesthesiologist   Preanesthetic Checklist Completed: patient identified, IV checked, risks and benefits discussed, monitors and equipment checked, pre-op evaluation and timeout performed  Epidural Patient position: sitting Prep: DuraPrep Patient monitoring: continuous pulse ox and blood pressure Approach: midline Location: L2-L3 Injection technique: LOR saline  Needle:  Needle type: Tuohy  Needle gauge: 17 G Needle length: 9 cm Needle insertion depth: 5.5 cm Catheter size: 19 Gauge Catheter at skin depth: 10 cm Test dose: negative and Other (1% lidocaine)  Assessment Events: blood not aspirated and no cerebrospinal fluid  Additional Notes Patient identified. Risks including, but not limited to, bleeding, infection, nerve damage, paralysis, inadequate analgesia, blood pressure changes, nausea, vomiting, allergic reaction, postpartum back pain, itching, and headache were discussed. Patient expressed understanding and wished to proceed. Sterile prep and drape, including hand hygiene, mask, and sterile gloves were used. The patient was positioned and the spine was prepped. The skin was anesthetized with lidocaine. No paraesthesia or other complication noted. The patient did not experience any signs of intravascular injection such as tinnitus or metallic taste in mouth, nor signs of intrathecal spread such as rapid motor block. Please see nursing notes for vital signs. The patient tolerated the procedure well.   Leslye Peer, MDReason for block:procedure for pain

## 2022-10-05 NOTE — Anesthesia Preprocedure Evaluation (Addendum)
Anesthesia Evaluation  Patient identified by MRN, date of birth, ID band Patient awake    Reviewed: Allergy & Precautions, NPO status , Patient's Chart, lab work & pertinent test results  History of Anesthesia Complications Negative for: history of anesthetic complications  Airway Mallampati: II   Neck ROM: Full    Dental   Pulmonary former smoker   Pulmonary exam normal        Cardiovascular negative cardio ROS Normal cardiovascular exam     Neuro/Psych  Headaches, Seizures -,  PSYCHIATRIC DISORDERS Anxiety        GI/Hepatic negative GI ROS, Neg liver ROS,,,  Endo/Other   Obesity   Renal/GU negative Renal ROS     Musculoskeletal negative musculoskeletal ROS (+)    Abdominal   Peds  Hematology negative hematology ROS (+)   Anesthesia Other Findings HSV  Reproductive/Obstetrics (+) Pregnancy                             Anesthesia Physical Anesthesia Plan  ASA: 2  Anesthesia Plan: Epidural   Post-op Pain Management:    Induction:   PONV Risk Score and Plan: 2 and Treatment may vary due to age or medical condition  Airway Management Planned: Natural Airway  Additional Equipment: None  Intra-op Plan:   Post-operative Plan:   Informed Consent: I have reviewed the patients History and Physical, chart, labs and discussed the procedure including the risks, benefits and alternatives for the proposed anesthesia with the patient or authorized representative who has indicated his/her understanding and acceptance.       Plan Discussed with: Anesthesiologist  Anesthesia Plan Comments: (Labs reviewed. Platelets acceptable, patient not taking any blood thinning medications. Per RN, FHR tracing reported to be stable enough for sitting procedure. Risks and benefits discussed with patient, including PDPH, backache, epidural hematoma, failed epidural, blood pressure changes, allergic  reaction, and nerve injury. Patient expressed understanding and wished to proceed.  Update: Code cesarean called for fetal decels amidst episode of maternal hypotension. Epidural working well throughout labor, plan to use for c-section.)       Anesthesia Quick Evaluation

## 2022-10-06 ENCOUNTER — Inpatient Hospital Stay (HOSPITAL_COMMUNITY)
Admission: AD | Admit: 2022-10-06 | Payer: BC Managed Care – PPO | Source: Home / Self Care | Admitting: Obstetrics and Gynecology

## 2022-10-06 ENCOUNTER — Encounter (HOSPITAL_COMMUNITY): Payer: Self-pay | Admitting: Obstetrics and Gynecology

## 2022-10-06 ENCOUNTER — Inpatient Hospital Stay (HOSPITAL_COMMUNITY): Payer: BC Managed Care – PPO

## 2022-10-06 LAB — CBC
HCT: 31.7 % — ABNORMAL LOW (ref 36.0–46.0)
Hemoglobin: 10.5 g/dL — ABNORMAL LOW (ref 12.0–15.0)
MCH: 29.3 pg (ref 26.0–34.0)
MCHC: 33.1 g/dL (ref 30.0–36.0)
MCV: 88.5 fL (ref 80.0–100.0)
Platelets: 194 10*3/uL (ref 150–400)
RBC: 3.58 MIL/uL — ABNORMAL LOW (ref 3.87–5.11)
RDW: 18 % — ABNORMAL HIGH (ref 11.5–15.5)
WBC: 19.7 10*3/uL — ABNORMAL HIGH (ref 4.0–10.5)
nRBC: 0 % (ref 0.0–0.2)

## 2022-10-06 MED ORDER — OXYCODONE HCL 5 MG PO TABS
5.0000 mg | ORAL_TABLET | Freq: Once | ORAL | Status: AC
  Filled 2022-10-06: qty 1

## 2022-10-06 NOTE — Progress Notes (Signed)
Subjective: Postpartum Day 1: Cesarean Delivery Patient reports tolerating PO.    Objective: Vital signs in last 24 hours: Temp:  [97.7 F (36.5 C)-98.4 F (36.9 C)] 98.4 F (36.9 C) (05/11 0141) Pulse Rate:  [71-109] 89 (05/11 0413) Resp:  [9-21] 18 (05/11 0413) BP: (66-135)/(34-94) 111/61 (05/11 0413) SpO2:  [95 %-100 %] 95 % (05/11 0413)  Physical Exam:  General: alert, cooperative, and no distress Lochia: appropriate Uterine Fundus: firm Incision: healing well DVT Evaluation: No evidence of DVT seen on physical exam.  Recent Labs    10/05/22 0928 10/06/22 0435  HGB 13.3 10.5*  HCT 42.5 31.7*    Assessment/Plan: Status post Cesarean section. Doing well postoperatively.  Continue current care.  Roselle Locus II, MD 10/06/2022, 8:14 AM

## 2022-10-06 NOTE — Plan of Care (Signed)
  Problem: Education: Goal: Knowledge of condition will improve Outcome: Completed/Met   

## 2022-10-06 NOTE — Progress Notes (Signed)
MOB was referred for history of anxiety.  * Referral screened out by Clinical Social Worker because none of the following criteria appear to apply:  ~ History of anxiety during this pregnancy, or of post-partum depression following prior delivery.  ~ Diagnosis of anxiety within last 3 years  OR  * MOB's symptoms currently being treated with medication and/or therapy.  Per OB notes, MOB is currently prescribed sertraline 50mg  and clonazepam 0.5mg .  Please contact the Clinical Social Worker if needs arise, by [facility] request, or if MOB scores greater than 9/yes to question 10 on [place] Postpartum Depression Screen.  Lindsey Swanson, Theresia Majors Clinical Social Worker [phone]

## 2022-10-06 NOTE — Anesthesia Postprocedure Evaluation (Signed)
Anesthesia Post Note  Patient: Lindsey Swanson  Procedure(s) Performed: CESAREAN SECTION (Abdomen)     Patient location during evaluation: PACU Anesthesia Type: Epidural Level of consciousness: awake and alert Pain management: pain level controlled Vital Signs Assessment: post-procedure vital signs reviewed and stable Respiratory status: spontaneous breathing, respiratory function stable and nonlabored ventilation Cardiovascular status: blood pressure returned to baseline Postop Assessment: epidural receding and no apparent nausea or vomiting Anesthetic complications: no   No notable events documented.  Last Vitals:  Vitals:   10/06/22 0335 10/06/22 0413  BP:  111/61  Pulse:  89  Resp:  18  Temp:    SpO2: 96% 95%    Last Pain:  Vitals:   10/06/22 0900  TempSrc:   PainSc: 3    Pain Goal:                   [name]

## 2022-10-07 LAB — CBC
HCT: 31.2 % — ABNORMAL LOW (ref 36.0–46.0)
Hemoglobin: 10.1 g/dL — ABNORMAL LOW (ref 12.0–15.0)
MCH: 28.6 pg (ref 26.0–34.0)
MCHC: 32.4 g/dL (ref 30.0–36.0)
MCV: 88.4 fL (ref 80.0–100.0)
Platelets: 203 10*3/uL (ref 150–400)
RBC: 3.53 MIL/uL — ABNORMAL LOW (ref 3.87–5.11)
RDW: 18.6 % — ABNORMAL HIGH (ref 11.5–15.5)
WBC: 16.6 10*3/uL — ABNORMAL HIGH (ref 4.0–10.5)
nRBC: 0 % (ref 0.0–0.2)

## 2022-10-07 LAB — COMPREHENSIVE METABOLIC PANEL
ALT: 16 U/L (ref 0–44)
AST: 27 U/L (ref 15–41)
Albumin: 2.3 g/dL — ABNORMAL LOW (ref 3.5–5.0)
Alkaline Phosphatase: 100 U/L (ref 38–126)
Anion gap: 9 (ref 5–15)
BUN: 10 mg/dL (ref 6–20)
CO2: 24 mmol/L (ref 22–32)
Calcium: 8.5 mg/dL — ABNORMAL LOW (ref 8.9–10.3)
Chloride: 102 mmol/L (ref 98–111)
Creatinine, Ser: 0.73 mg/dL (ref 0.44–1.00)
GFR, Estimated: >60 mL/min (ref >60)
Glucose, Bld: 78 mg/dL (ref 70–99)
Potassium: 3.9 mmol/L (ref 3.5–5.1)
Sodium: 135 mmol/L (ref 135–145)
Total Bilirubin: 0.3 mg/dL (ref 0.3–1.2)
Total Protein: 5.1 g/dL — ABNORMAL LOW (ref 6.5–8.1)

## 2022-10-07 MED ORDER — BUTALBITAL-APAP-CAFFEINE 50-325-40 MG PO TABS
2.0000 | ORAL_TABLET | Freq: Four times a day (QID) | ORAL | Status: DC | PRN
  Administered 2022-10-07 – 2022-10-08 (×4): 2 via ORAL
  Filled 2022-10-07 (×4): qty 2

## 2022-10-07 NOTE — Lactation Note (Signed)
This note was copied from a baby's chart. Lactation Consultation Note  Patient Name: Lindsey Swanson [id number] Date: 10/07/2022 Age:34 hours, P2  Reason for consult: Follow-up assessment;Breast augmentation;Term;Infant weight loss (3 % weight loss) Per dad baby recently fed formula. Per mom has a headache of 7.  LC fluids and rest. Per MBURN mom will be started on Floricet - L - LC recommended when her HA is better to give the Baby practice at the breast latching and to call for assistance if needed.   Maternal Data Has patient been taught Hand Expression?: Yes Does the patient have breastfeeding experience prior to this delivery?: Yes How long did the patient breastfeed?: per mom short time due to DL  Feeding Mother's Current Feeding Choice: Breast Milk and Formula Nipple Type: Slow - flow  LATCH Score - last latch score 8    Lactation Tools Discussed/Used Tools: Pump;Flanges Breast pump type: Double-Electric Breast Pump Pump Education: Other (comment) (already set up)  Interventions Interventions: Breast feeding basics reviewed;Education;DEBP;Pace feeding;LC Services brochure  Discharge    Consult Status Consult Status: Follow-up Date: 10/08/22 Follow-up type: In-patient    Lindsey Swanson [name] 10/07/2022, 10:44 AM

## 2022-10-07 NOTE — Progress Notes (Signed)
Subjective: Postpartum Day 2: Cesarean Delivery Patient reports incisional pain, tolerating PO, + flatus, and no problems voiding.   C/O HA. She has a Hx of HA. Had mild HA yesterday and worse this morning. No N/V, no neck stiffness. She normally consumes caffeine at home, little in hospital so far. Has tolerated Fioricet in past without difficulty.  Objective: Vital signs in last 24 hours: Temp:  [97.4 F (36.3 C)-98 F (36.7 C)] 97.4 F (36.3 C) (05/12 0532) Pulse Rate:  [83-95] 83 (05/12 0532) Resp:  [18] 18 (05/12 0532) BP: (104-108)/(67-81) 108/79 (05/12 0532) SpO2:  [99 %] 99 % (05/12 0532)  Physical Exam:  General: alert, cooperative, and no distress Lochia: appropriate Uterine Fundus: firm Incision: healing well DVT Evaluation: No evidence of DVT seen on physical exam. Neck supple DTR 2+ without clonus Recent Labs    10/06/22 0435 10/07/22 0459  HGB 10.5* 10.1*  HCT 31.7* 31.2*    Assessment/Plan: Status post Cesarean section. Postoperative course complicated by HA   Fioricet ordered. CMET today D/W possible spinal HA.   Roselle Locus II, MD 10/07/2022, 8:20 AM

## 2022-10-08 MED ORDER — IBUPROFEN 600 MG PO TABS: 600.0000 mg | ORAL_TABLET | Freq: Four times a day (QID) | ORAL | 0 refills | Status: AC | PRN

## 2022-10-08 MED ORDER — OXYCODONE HCL 5 MG PO TABS: 5.0000 mg | ORAL_TABLET | ORAL | 0 refills | Status: AC | PRN

## 2022-10-08 MED ORDER — ACETAMINOPHEN 325 MG PO TABS: 650.0000 mg | ORAL_TABLET | Freq: Four times a day (QID) | ORAL | 0 refills | Status: AC | PRN

## 2022-10-08 NOTE — Progress Notes (Signed)
Postpartum Progress Note  Postpartum Day 3 s/p primary Cesarean section.  Subjective:  Patient reports no overnight events.  She reports well controlled pain, ambulating without difficulty, voiding spontaneously, tolerating PO.  She reports Positive flatus, Negative BM.  Vaginal bleeding is minimal.  Objective: Blood pressure 104/65, pulse 75, temperature 98.2 F (36.8 C), temperature source Oral, resp. rate 16, height 5\' 7"  (1.702 m), weight 103.4 kg, SpO2 98 %, unknown if currently breastfeeding.  Physical Exam:  General: alert and no distress Lochia: appropriate Abdomen: soft, ATTP Uterine Fundus: firm Incision: clean/dry/intact DVT Evaluation: No evidence of DVT seen on physical exam.  Recent Labs    10/06/22 0435 10/07/22 0459  HGB 10.5* 10.1*  HCT 31.7* 31.2*    Assessment/Plan: Postpartum Day 3, s/p C-section Headache postpartum Normotensive, CMP wnl Resolved with fioricet Lactation following Abdominal binder Incision care discussed Doing well, continue routine postpartum care. Anticipate discharge home today   LOS: 3 days   [name] 10/08/2022, 7:31 AM

## 2022-10-08 NOTE — Discharge Summary (Signed)
Obstetric Discharge Summary  Lindsey Swanson is a 32 y.o. female that presented on 10/05/2022 for post dates induction of labor.  Her labor course was complicated by non reassuring fetal heart tracing, prolonged fetal bradycardia, and she underwent primary C section and delivered a viable female on 10/05/22.  Her postpartum course was uncomplicated and on PPD#3, she reported well controlled pain, spontaneous voiding, ambulating without difficulty, and tolerating PO.  She was stable for discharge home on 10/08/22 with plans for in-office follow up.  Hemoglobin  Date Value Ref Range Status  10/07/2022 10.1 (L) 12.0 - 15.0 g/dL Final   HCT  Date Value Ref Range Status  10/07/2022 31.2 (L) 36.0 - 46.0 % Final    Physical Exam:  General: alert and no distress Lochia: appropriate Uterine Fundus: firm Incision: healing well DVT Evaluation: No evidence of DVT seen on physical exam.  Discharge Diagnoses: Term Pregnancy-delivered  Discharge Information: Date: 10/08/2022 Activity: Pelvic rest, as tolerated Diet: routine Medications: Tylenol, motrin, oxycodone Condition: stable Instructions: Refer to practice specific booklet.  Discussed prior to discharge.  Discharge to: Home  Follow-up Information     [place] Follow up.   Why: Please follow up for a 6 week postpartum visit. Contact information: [address] [place] [address] [phone]                 Newborn Data: Live born female  Birth Weight: 9 lb 13 oz (4450 g) APGAR: 8, 9  Newborn Delivery   Birth date/time: 10/05/2022 17:11:28 Delivery type: C-Section, Low Transverse Trial of labor: Yes C-section categorization: Primary      Home with mother.  Lindsey Swanson 10/08/2022, 2:59 PM

## 2022-10-08 NOTE — Lactation Note (Signed)
This note was copied from a baby's chart. Lactation Consultation Note  Patient Name: Lindsey Swanson [id number] Date: 10/08/2022 Age:34 hours Reason for consult: Follow-up assessment;Term;Infant weight loss;Breastfeeding assistance;Breast augmentation (4.25% WL)  Infant is at 17 hours old.  LC entered the room and the supporting parent was holding the infant.  The birth parent stated that things were going well with breastfeeding.  LC educated the birth parent on engorgement, mastitis, warning signs, infant I/O, and breast care.  The birth parent is aware of the outpatient lactation services.  The birth parent had no further questions or concerns.   Infant Feeding Plan:  Breastfeed 8+ times in 24 hours according to feeding cues.  Put the infant to the breast prior to supplementing.  Pump as needed and feed the expressed milk to the infant via a bottle.  Supplement according to supplementation guidelines.  Watch infant output and call the pediatrician with concerns.  Call the outpatient lactation consultant for assistance with breastfeeding.  Prioritize maternal rest, hydration, and nutrition.    Feeding Mother's Current Feeding Choice: Breast Milk and Formula  Interventions Interventions: Education  Discharge Discharge Education: Engorgement and breast care;Warning signs for feeding baby;Outpatient recommendation  Consult Status Consult Status: Complete Date: 10/08/22 Follow-up type: Call as needed   Delene Loll 10/08/2022, 11:41 AM

## 2022-10-09 LAB — SURGICAL PATHOLOGY

## 2022-10-16 ENCOUNTER — Telehealth (HOSPITAL_COMMUNITY): Payer: Self-pay | Admitting: *Deleted

## 2022-10-16 NOTE — Telephone Encounter (Signed)
Attempted Hospital Discharge Follow-Up Call.  Voice mail box is full.  Unable to leave a message.
